# Patient Record
Sex: Male | Born: 1965 | Race: Black or African American | Hispanic: No | Marital: Single | Smoking: Current every day smoker
Health system: Southern US, Community
[De-identification: ages and names within clinical notes are randomized; demographics above are authoritative.]

## PROBLEM LIST (undated history)

## (undated) DIAGNOSIS — R42 Dizziness and giddiness: Secondary | ICD-10-CM

## (undated) DIAGNOSIS — Z720 Tobacco use: Secondary | ICD-10-CM

## (undated) DIAGNOSIS — F1010 Alcohol abuse, uncomplicated: Secondary | ICD-10-CM

## (undated) HISTORY — PX: NO PAST SURGERIES: SHX2092

---

## 2010-08-02 ENCOUNTER — Emergency Department: Payer: Self-pay | Admitting: Emergency Medicine

## 2010-12-20 ENCOUNTER — Emergency Department: Payer: Self-pay | Admitting: Emergency Medicine

## 2011-08-06 ENCOUNTER — Emergency Department: Payer: Self-pay | Admitting: Emergency Medicine

## 2012-12-30 ENCOUNTER — Emergency Department: Payer: Self-pay | Admitting: Emergency Medicine

## 2017-06-09 ENCOUNTER — Encounter: Payer: Self-pay | Admitting: Emergency Medicine

## 2017-06-09 ENCOUNTER — Inpatient Hospital Stay
Admission: EM | Admit: 2017-06-09 | Discharge: 2017-06-12 | DRG: 379 | Disposition: A | Discharge status: Home / Self Care | Payer: Self-pay | Attending: Internal Medicine | Admitting: Internal Medicine

## 2017-06-09 ENCOUNTER — Emergency Department: Payer: Self-pay

## 2017-06-09 DIAGNOSIS — F1010 Alcohol abuse, uncomplicated: Secondary | ICD-10-CM | POA: Diagnosis present

## 2017-06-09 DIAGNOSIS — E876 Hypokalemia: Secondary | ICD-10-CM | POA: Diagnosis present

## 2017-06-09 DIAGNOSIS — K922 Gastrointestinal hemorrhage, unspecified: Secondary | ICD-10-CM | POA: Diagnosis present

## 2017-06-09 DIAGNOSIS — Z23 Encounter for immunization: Secondary | ICD-10-CM

## 2017-06-09 DIAGNOSIS — E663 Overweight: Secondary | ICD-10-CM | POA: Diagnosis present

## 2017-06-09 DIAGNOSIS — F17200 Nicotine dependence, unspecified, uncomplicated: Secondary | ICD-10-CM | POA: Diagnosis present

## 2017-06-09 DIAGNOSIS — K625 Hemorrhage of anus and rectum: Secondary | ICD-10-CM

## 2017-06-09 DIAGNOSIS — K921 Melena: Principal | ICD-10-CM | POA: Diagnosis present

## 2017-06-09 DIAGNOSIS — R55 Syncope and collapse: Secondary | ICD-10-CM

## 2017-06-09 DIAGNOSIS — Z6825 Body mass index (BMI) 25.0-25.9, adult: Secondary | ICD-10-CM

## 2017-06-09 HISTORY — DX: Tobacco use: Z72.0

## 2017-06-09 HISTORY — DX: Alcohol abuse, uncomplicated: F10.10

## 2017-06-09 LAB — CBC
HEMATOCRIT: 29.4 % — AB (ref 40.0–52.0)
HEMOGLOBIN: 10 g/dL — AB (ref 13.0–18.0)
MCH: 32.9 pg (ref 26.0–34.0)
MCHC: 34.1 g/dL (ref 32.0–36.0)
MCV: 96.6 fL (ref 80.0–100.0)
Platelets: 207 10*3/uL (ref 150–440)
RBC: 3.05 MIL/uL — ABNORMAL LOW (ref 4.40–5.90)
RDW: 12.1 % (ref 11.5–14.5)
WBC: 13 10*3/uL — AB (ref 3.8–10.6)

## 2017-06-09 LAB — COMPREHENSIVE METABOLIC PANEL
ALBUMIN: 3 g/dL — AB (ref 3.5–5.0)
ALT: 13 U/L — ABNORMAL LOW (ref 17–63)
ANION GAP: 10 (ref 5–15)
AST: 23 U/L (ref 15–41)
Alkaline Phosphatase: 58 U/L (ref 38–126)
BUN: 9 mg/dL (ref 6–20)
CHLORIDE: 104 mmol/L (ref 101–111)
CO2: 20 mmol/L — ABNORMAL LOW (ref 22–32)
Calcium: 8 mg/dL — ABNORMAL LOW (ref 8.9–10.3)
Creatinine, Ser: 1.2 mg/dL (ref 0.61–1.24)
GFR calc Af Amer: >60 mL/min (ref >60)
GFR calc non Af Amer: >60 mL/min (ref >60)
GLUCOSE: 139 mg/dL — AB (ref 65–99)
POTASSIUM: 3.1 mmol/L — AB (ref 3.5–5.1)
Sodium: 134 mmol/L — ABNORMAL LOW (ref 135–145)
Total Bilirubin: 0.6 mg/dL (ref 0.3–1.2)
Total Protein: 5.7 g/dL — ABNORMAL LOW (ref 6.5–8.1)

## 2017-06-09 LAB — TYPE AND SCREEN
ABO/RH(D): O POS
ANTIBODY SCREEN: NEGATIVE

## 2017-06-09 LAB — LIPASE, BLOOD: LIPASE: 17 U/L (ref 11–51)

## 2017-06-09 LAB — TROPONIN I: Troponin I: <0.03 ng/mL (ref <0.03)

## 2017-06-09 MED ORDER — SODIUM CHLORIDE 0.9 % IV BOLUS (SEPSIS)
1000.0000 mL | Freq: Once | INTRAVENOUS | Status: AC
  Administered 2017-06-09: 1000 mL via INTRAVENOUS

## 2017-06-09 NOTE — ED Notes (Signed)
Pt went to nuclear medicine for a study.

## 2017-06-09 NOTE — ED Provider Notes (Signed)
[facility] Emergency Department Provider Note  ____________________________________________   First MD Initiated Contact with Patient 06/09/17 2202     (approximate)  I have reviewed the triage vital signs and the nursing notes.   HISTORY  Chief Complaint Rectal Bleeding   HPI Matthew Nolan is a 51 y.o. male with a history of alcoholism who was presented to the emergency department today with blood per rectum. He has had 2 episodes of bleeding since 8 PM tonight. An initial episode of bleeding which was mild to moderate and then another 1:30 to 40 minutes ago with copious amount of clot. He also was near syncopal at this time the patient call the ambulance and said that he was he was going to pass out. He also vomited once but says that he has vomited beer. He says that he drinks daily and has had 6 beers today. Does not take any anticoagulants. Does not take any medications at home on a regular basis. Reports over the past 2 days that he has had some mild suprapubic as well as left lower quadrant abdominal cramping.patient says that he does not feel like he needs to move his bowels at this time.   History reviewed. No pertinent past medical history.  There are no active problems to display for this patient.   History reviewed. No pertinent surgical history.  Prior to Admission medications   Not on File    Allergies Patient has no known allergies.  History reviewed. No pertinent family history.  Social History Social History  Substance Use Topics  . Smoking status: Current Every Day Smoker  . Smokeless tobacco: Never Used  . Alcohol use 25.2 oz/week    42 Cans of beer per week    Review of Systems  Constitutional: No fever/chills Eyes: No visual changes. ENT: No sore throat. Cardiovascular: Denies chest pain. Respiratory: Denies shortness of breath. Gastrointestinal:  No diarrhea.  No constipation. Genitourinary: Negative for  dysuria. Musculoskeletal: Negative for back pain. Skin: Negative for rash. Neurological: Negative for headaches, focal weakness or numbness.   ____________________________________________   PHYSICAL EXAM:  VITAL SIGNS: ED Triage Vitals [06/09/17 2157]  Enc Vitals Group     BP      Pulse      Resp      Temp      Temp src      SpO2      Weight 168 lb (76.2 kg)     Height 5\' 8"  (1.727 m)     Head Circumference      Peak Flow      Pain Score      Pain Loc      Pain Edu?      Excl. in [place]?     Constitutional: Alert and oriented. no acute distress. There is maroon blood staining on the patient's socks. Eyes: Conjunctivae are normal.  Head: Atraumatic. Nose: No congestion/rhinnorhea. Mouth/Throat: Mucous membranes are moist.  Neck: No stridor.   Cardiovascular: Normal rate, regular rhythm. Grossly normal heart sounds.  Respiratory: Normal respiratory effort.  No retractions. Lungs CTAB. Gastrointestinal: Soft with minimal tenderness to the suprapubic as well as left lower quadrant without distention, rebound or guarding.  No distention. maroon blood surrounding the anus, exteriorly. Musculoskeletal: No lower extremity tenderness nor edema.  No joint effusions. Neurologic:  Normal speech and language. No gross focal neurologic deficits are appreciated. Skin:  Skin is warm, dry and intact. No rash noted. Psychiatric: Mood and affect are normal.  Speech and behavior are normal.  ____________________________________________   LABS (all labs ordered are listed, but only abnormal results are displayed)  Labs Reviewed  COMPREHENSIVE METABOLIC PANEL - Abnormal; Notable for the following:       Result Value   Sodium 134 (*)    Potassium 3.1 (*)    CO2 20 (*)    Glucose, Bld 139 (*)    Calcium 8.0 (*)    Total Protein 5.7 (*)    Albumin 3.0 (*)    ALT 13 (*)    All other components within normal limits  CBC - Abnormal; Notable for the following:    WBC 13.0 (*)    RBC 3.05  (*)    Hemoglobin 10.0 (*)    HCT 29.4 (*)    All other components within normal limits  TROPONIN I  LIPASE, BLOOD  PROTIME-INR  APTT  POC OCCULT BLOOD, ED  TYPE AND SCREEN   ____________________________________________  EKG  ED ECG REPORT I, Doran Stabler, the attending physician, personally viewed and interpreted this ECG.   Date: 06/09/2017  EKG Time: 2204  Rate: 99  Rhythm: normal sinus rhythm  Axis: Normal  Intervals:none  ST&T Change: 1 mm ST elevation in V3 with 0.5 mm ST elevation in V4 and V5. No reciprocal depression. Single T-wave inversion in aVL.  ____________________________________________  RADIOLOGY   ____________________________________________   PROCEDURES  Procedure(s) performed:   Procedures  Critical Care performed:  CRITICAL CARE Performed by: Doran Stabler   Total critical care time: 35 minutes  Critical care time was exclusive of separately billable procedures and treating other patients.  Critical care was necessary to treat or prevent imminent or life-threatening deterioration.  Critical care was time spent personally by me on the following activities: development of treatment plan with patient and/or surrogate as well as nursing, discussions with consultants, evaluation of patient's response to treatment, examination of patient, obtaining history from patient or surrogate, ordering and performing treatments and interventions, ordering and review of laboratory studies, ordering and review of radiographic studies, pulse oximetry and re-evaluation of patient's condition.   ____________________________________________   INITIAL IMPRESSION / ASSESSMENT AND PLAN / ED COURSE  Pertinent labs & imaging results that were available during my care of the patient were reviewed by me and considered in my medical decision making (see chart for details).  DX: Lower GI bleeding, diverticulosis, upper GI bleed with rapid transit  time.  ----------------------------------------- 11:47 PM on 06/09/2017 -----------------------------------------  Discussed the case with Dr. Lance Morin who agrees with the tagged red blood cell scan and possible IR intervention. I also discussed the case with the interventional radiologist Dr. Earleen Newport who is unsure of the interventional capabilities here at [facility] and says that we are unable to do any intervention overnight. I then discussed the case with Dr. Dion Saucier at [facility]  who recommends a CT angiography of the abdomen and pelvis and reconsultation with Dr. Marius Ditch at [facility].  pending pages from GI at [facility] at this time. Signed out to Dr. Owens Shark.patient has remained stable with a heart rate in the 90s and blood pressures in the 1 teens to 120s throughout his ER stay.no further GI bleeding at this time.      ____________________________________________   FINAL CLINICAL IMPRESSION(S) / ED DIAGNOSES  lower GI bleeding.  Near syncope.      NEW MEDICATIONS STARTED DURING THIS VISIT:  New Prescriptions   No medications on file  Note:  This document was prepared using Dragon voice recognition software and may include unintentional dictation errors.     Orbie Pyo, MD 06/09/17 2350

## 2017-06-09 NOTE — ED Notes (Signed)
[facility] has been called to initiate transfer per Dr. Marolyn Haller orders.

## 2017-06-09 NOTE — ED Triage Notes (Signed)
Pt arrived to ED via EMS from home where EMS reports pt has had bloody stools x3 days and today at 1400 the bleeding worsened and pt started to pass clots. Per EMS pt was diaphoretic on arrival to home with BP 70/40, actively bleeding from rectum. On arrival to ED pt is A&O x4, actively bleeding from rectum, BP 111/74. MD to bedside for further evaluation. Pt drinks approximately 6 beers per day, no known medical HX, per pt.

## 2017-06-10 ENCOUNTER — Encounter: Payer: Self-pay | Admitting: Internal Medicine

## 2017-06-10 ENCOUNTER — Other Ambulatory Visit: Payer: Self-pay

## 2017-06-10 DIAGNOSIS — F1410 Cocaine abuse, uncomplicated: Secondary | ICD-10-CM

## 2017-06-10 DIAGNOSIS — K922 Gastrointestinal hemorrhage, unspecified: Secondary | ICD-10-CM | POA: Diagnosis present

## 2017-06-10 DIAGNOSIS — K625 Hemorrhage of anus and rectum: Secondary | ICD-10-CM

## 2017-06-10 DIAGNOSIS — F1010 Alcohol abuse, uncomplicated: Secondary | ICD-10-CM

## 2017-06-10 LAB — CBC
HCT: 27.8 % — ABNORMAL LOW (ref 40.0–52.0)
Hemoglobin: 9.6 g/dL — ABNORMAL LOW (ref 13.0–18.0)
MCH: 33.7 pg (ref 26.0–34.0)
MCHC: 34.5 g/dL (ref 32.0–36.0)
MCV: 97.7 fL (ref 80.0–100.0)
Platelets: 192 10*3/uL (ref 150–440)
RBC: 2.84 MIL/uL — ABNORMAL LOW (ref 4.40–5.90)
RDW: 12.3 % (ref 11.5–14.5)
WBC: 10.4 10*3/uL (ref 3.8–10.6)

## 2017-06-10 LAB — APTT: aPTT: 31 seconds (ref 24–36)

## 2017-06-10 LAB — HEMOGLOBIN A1C
Hgb A1c MFr Bld: 4.4 % — ABNORMAL LOW (ref 4.8–5.6)
Mean Plasma Glucose: 79.58 mg/dL

## 2017-06-10 LAB — PROTIME-INR
INR: 1.04
Prothrombin Time: 13.5 seconds (ref 11.4–15.2)

## 2017-06-10 LAB — URINE DRUG SCREEN, QUALITATIVE (ARMC ONLY)
Amphetamines, Ur Screen: NOT DETECTED
BARBITURATES, UR SCREEN: NOT DETECTED
BENZODIAZEPINE, UR SCRN: NOT DETECTED
Cannabinoid 50 Ng, Ur ~~LOC~~: POSITIVE — AB
Cocaine Metabolite,Ur ~~LOC~~: POSITIVE — AB
MDMA (Ecstasy)Ur Screen: NOT DETECTED
METHADONE SCREEN, URINE: NOT DETECTED
Opiate, Ur Screen: NOT DETECTED
Phencyclidine (PCP) Ur S: NOT DETECTED
Tricyclic, Ur Screen: NOT DETECTED

## 2017-06-10 LAB — HEMOGLOBIN AND HEMATOCRIT, BLOOD
HEMATOCRIT: 28.4 % — AB (ref 40.0–52.0)
HEMATOCRIT: 25 % — AB (ref 40.0–52.0)
HEMOGLOBIN: 8.6 g/dL — AB (ref 13.0–18.0)
Hemoglobin: 9.6 g/dL — ABNORMAL LOW (ref 13.0–18.0)

## 2017-06-10 LAB — TSH: TSH: 5.764 u[IU]/mL — ABNORMAL HIGH (ref 0.350–4.500)

## 2017-06-10 MED ORDER — DOCUSATE SODIUM 100 MG PO CAPS
100.0000 mg | ORAL_CAPSULE | Freq: Two times a day (BID) | ORAL | Status: DC
  Administered 2017-06-11 – 2017-06-12 (×3): 100 mg via ORAL
  Filled 2017-06-10 (×5): qty 1

## 2017-06-10 MED ORDER — ONDANSETRON HCL 4 MG/2ML IJ SOLN: 4.0000 mg | Freq: Four times a day (QID) | INTRAMUSCULAR | Status: DC | PRN

## 2017-06-10 MED ORDER — THIAMINE HCL 100 MG/ML IJ SOLN: 100.0000 mg | Freq: Every day | INTRAMUSCULAR | Status: DC

## 2017-06-10 MED ORDER — TECHNETIUM TC 99M-LABELED RED BLOOD CELLS IV KIT
20.0000 | PACK | Freq: Once | INTRAVENOUS | Status: AC | PRN
  Administered 2017-06-10: 21.7 via INTRAVENOUS

## 2017-06-10 MED ORDER — INFLUENZA VAC SPLIT QUAD 0.5 ML IM SUSY
0.5000 mL | PREFILLED_SYRINGE | INTRAMUSCULAR | Status: AC
  Administered 2017-06-12: 0.5 mL via INTRAMUSCULAR
  Filled 2017-06-10: qty 0.5

## 2017-06-10 MED ORDER — LORAZEPAM 2 MG/ML IJ SOLN: 0.0000 mg | Freq: Four times a day (QID) | INTRAMUSCULAR | Status: DC

## 2017-06-10 MED ORDER — FOLIC ACID 1 MG PO TABS
1.0000 mg | ORAL_TABLET | Freq: Every day | ORAL | Status: DC
  Administered 2017-06-10: 1 mg via ORAL
  Filled 2017-06-10: qty 1

## 2017-06-10 MED ORDER — ACETAMINOPHEN 650 MG RE SUPP: 650.0000 mg | Freq: Four times a day (QID) | RECTAL | Status: DC | PRN

## 2017-06-10 MED ORDER — POTASSIUM CHLORIDE 10 MEQ/100ML IV SOLN
10.0000 meq | INTRAVENOUS | Status: AC
  Administered 2017-06-10 (×4): 10 meq via INTRAVENOUS
  Filled 2017-06-10 (×4): qty 100

## 2017-06-10 MED ORDER — PANTOPRAZOLE SODIUM 40 MG IV SOLR
40.0000 mg | Freq: Two times a day (BID) | INTRAVENOUS | Status: DC
  Administered 2017-06-10 – 2017-06-12 (×5): 40 mg via INTRAVENOUS
  Filled 2017-06-10 (×5): qty 40

## 2017-06-10 MED ORDER — LORAZEPAM 2 MG/ML IJ SOLN
0.0000 mg | Freq: Two times a day (BID) | INTRAMUSCULAR | Status: DC
Start: 2017-06-13 — End: 2017-06-12

## 2017-06-10 MED ORDER — THIAMINE HCL 100 MG/ML IJ SOLN
Freq: Once | INTRAVENOUS | Status: DC
  Filled 2017-06-10: qty 1000

## 2017-06-10 MED ORDER — VITAMIN B-1 100 MG PO TABS
100.0000 mg | ORAL_TABLET | Freq: Every day | ORAL | Status: DC
  Administered 2017-06-10: 100 mg via ORAL
  Filled 2017-06-10: qty 1

## 2017-06-10 MED ORDER — M.V.I. ADULT IV INJ
INJECTION | Freq: Once | INTRAVENOUS | Status: AC
  Administered 2017-06-10: 17:00:00 via INTRAVENOUS
  Filled 2017-06-10: qty 1000

## 2017-06-10 MED ORDER — LORAZEPAM 2 MG/ML IJ SOLN: 1.0000 mg | Freq: Four times a day (QID) | INTRAMUSCULAR | Status: DC | PRN

## 2017-06-10 MED ORDER — ADULT MULTIVITAMIN W/MINERALS CH
1.0000 | ORAL_TABLET | Freq: Every day | ORAL | Status: DC
  Administered 2017-06-10 – 2017-06-12 (×3): 1 via ORAL
  Filled 2017-06-10 (×3): qty 1

## 2017-06-10 MED ORDER — POTASSIUM CHLORIDE 10 MEQ/100ML IV SOLN
10.0000 meq | INTRAVENOUS | Status: DC
  Filled 2017-06-10 (×4): qty 100

## 2017-06-10 MED ORDER — LORAZEPAM 1 MG PO TABS: 1.0000 mg | ORAL_TABLET | Freq: Four times a day (QID) | ORAL | Status: DC | PRN

## 2017-06-10 MED ORDER — SODIUM CHLORIDE 0.9 % IV SOLN
INTRAVENOUS | Status: DC
  Administered 2017-06-11: 11:00:00 via INTRAVENOUS

## 2017-06-10 MED ORDER — ACETAMINOPHEN 325 MG PO TABS: 650.0000 mg | ORAL_TABLET | Freq: Four times a day (QID) | ORAL | Status: DC | PRN

## 2017-06-10 MED ORDER — SODIUM CHLORIDE 0.9 % IV SOLN
INTRAVENOUS | Status: DC
  Administered 2017-06-10 (×2): via INTRAVENOUS

## 2017-06-10 MED ORDER — PEG 3350-KCL-NA BICARB-NACL 420 G PO SOLR
4000.0000 mL | Freq: Once | ORAL | Status: DC
  Filled 2017-06-10: qty 4000

## 2017-06-10 MED ORDER — ONDANSETRON HCL 4 MG PO TABS: 4.0000 mg | ORAL_TABLET | Freq: Four times a day (QID) | ORAL | Status: DC | PRN

## 2017-06-10 NOTE — H&P (Signed)
Matthew Nolan is an 51 y.o. male.   Chief Complaint: Rectal bleeding HPI: The patient with past medical history of alcohol and tobacco abuse presents emergency department with bright red bleeding per rectum. The patient had 2 bowel movements both of which were grossly bloody. The second bowel movement also had a heavy burden of clot. The patient underwent a bleeding scan in the emergency department and gastroenterology has agreed to see him. He is hemodynamically stable. He reports drinking at least 6 beers per day. The hospitalist service was contacted for admission and medical management.  Past Medical History:  Diagnosis Date  . Alcohol abuse   . Tobacco abuse     Past Surgical History:  Procedure Laterality Date  . NO PAST SURGERIES      Family History  Problem Relation Age of Onset  . Family history unknown: Yes   Social History:  reports that he has been smoking.  He has never used smokeless tobacco. He reports that he drinks about 25.2 oz of alcohol per week . He reports that he does not use drugs.  Allergies: No Known Allergies  No prescriptions prior to admission.    Results for orders placed or performed during the hospital encounter of 06/09/17 (from the past 48 hour(s))  Comprehensive metabolic panel     Status: Abnormal   Collection Time: 06/09/17  9:57 PM  Result Value Ref Range   Sodium 134 (L) 135 - 145 mmol/L   Potassium 3.1 (L) 3.5 - 5.1 mmol/L   Chloride 104 101 - 111 mmol/L   CO2 20 (L) 22 - 32 mmol/L   Glucose, Bld 139 (H) 65 - 99 mg/dL   BUN 9 6 - 20 mg/dL   Creatinine, Ser 1.20 0.61 - 1.24 mg/dL   Calcium 8.0 (L) 8.9 - 10.3 mg/dL   Total Protein 5.7 (L) 6.5 - 8.1 g/dL   Albumin 3.0 (L) 3.5 - 5.0 g/dL   AST 23 15 - 41 U/L   ALT 13 (L) 17 - 63 U/L   Alkaline Phosphatase 58 38 - 126 U/L   Total Bilirubin 0.6 0.3 - 1.2 mg/dL   GFR calc non Af Amer >60 >60 mL/min   GFR calc Af Amer >60 >60 mL/min    Comment: (NOTE) The eGFR has been calculated using  the CKD EPI equation. This calculation has not been validated in all clinical situations. eGFR's persistently <60 mL/min signify possible Chronic Kidney Disease.    Anion gap 10 5 - 15  CBC     Status: Abnormal   Collection Time: 06/09/17  9:57 PM  Result Value Ref Range   WBC 13.0 (H) 3.8 - 10.6 K/uL   RBC 3.05 (L) 4.40 - 5.90 MIL/uL   Hemoglobin 10.0 (L) 13.0 - 18.0 g/dL   HCT 29.4 (L) 40.0 - 52.0 %   MCV 96.6 80.0 - 100.0 fL   MCH 32.9 26.0 - 34.0 pg   MCHC 34.1 32.0 - 36.0 g/dL   RDW 12.1 11.5 - 14.5 %   Platelets 207 150 - 440 K/uL  Type and screen [facility]     Status: None   Collection Time: 06/09/17  9:57 PM  Result Value Ref Range   ABO/RH(D) O POS    Antibody Screen NEG    Sample Expiration 06/12/2017   Troponin I     Status: None   Collection Time: 06/09/17  9:57 PM  Result Value Ref Range   Troponin I <0.03 <0.03 ng/mL  Lipase, blood     Status: None   Collection Time: 06/09/17  9:57 PM  Result Value Ref Range   Lipase 17 11 - 51 U/L  TSH     Status: Abnormal   Collection Time: 06/09/17  9:57 PM  Result Value Ref Range   TSH 5.764 (H) 0.350 - 4.500 uIU/mL    Comment: Performed by a 3rd Generation assay with a functional sensitivity of <=0.01 uIU/mL.  Protime-INR     Status: None   Collection Time: 06/10/17  4:37 AM  Result Value Ref Range   Prothrombin Time 13.5 11.4 - 15.2 seconds   INR 1.04   APTT     Status: None   Collection Time: 06/10/17  4:37 AM  Result Value Ref Range   aPTT 31 24 - 36 seconds  CBC     Status: Abnormal   Collection Time: 06/10/17  4:37 AM  Result Value Ref Range   WBC 10.4 3.8 - 10.6 K/uL   RBC 2.84 (L) 4.40 - 5.90 MIL/uL   Hemoglobin 9.6 (L) 13.0 - 18.0 g/dL   HCT 27.8 (L) 40.0 - 52.0 %   MCV 97.7 80.0 - 100.0 fL   MCH 33.7 26.0 - 34.0 pg   MCHC 34.5 32.0 - 36.0 g/dL   RDW 12.3 11.5 - 14.5 %   Platelets 192 150 - 440 K/uL   Nm Gi Blood Loss  Result Date: 06/10/2017 CLINICAL DATA:  Acute onset of  bloody stools and epigastric cramping. Initial encounter. EXAM: NUCLEAR MEDICINE GASTROINTESTINAL BLEEDING SCAN TECHNIQUE: Sequential abdominal images were obtained following intravenous administration of Tc-97mlabeled red blood cells. RADIOPHARMACEUTICALS:  21.70 mCi Tc-971mn-vitro labeled red cells. COMPARISON:  None. FINDINGS: No abnormal accumulation of activity is visualized to suggest active hemorrhage into small or large bowel. There is normal blood pool and accumulation of activity within the heart, liver and spleen. There is accumulation of activity within the bladder, and also within the penile shaft. IMPRESSION: No evidence for active GI bleeding at this time. Electronically Signed   By: JeGarald Balding.D.   On: 06/10/2017 03:25    Review of Systems  Constitutional: Negative for chills and fever.  HENT: Negative for sore throat and tinnitus.   Eyes: Negative for blurred vision and redness.  Respiratory: Negative for cough and shortness of breath.   Cardiovascular: Negative for chest pain, palpitations, orthopnea and PND.  Gastrointestinal: Positive for blood in stool. Negative for abdominal pain, diarrhea, nausea and vomiting.  Genitourinary: Negative for dysuria, frequency and urgency.  Musculoskeletal: Negative for joint pain and myalgias.  Skin: Negative for rash.       No lesions  Neurological: Negative for speech change, focal weakness and weakness.  Endo/Heme/Allergies: Does not bruise/bleed easily.       No temperature intolerance  Psychiatric/Behavioral: Negative for depression and suicidal ideas.    Blood pressure 114/81, pulse 91, temperature 97.9 F (36.6 C), temperature source Oral, resp. rate 19, height 5' 8"  (1.727 m), weight 70.8 kg (156 lb), SpO2 100 %. Physical Exam  Nursing note and vitals reviewed. Constitutional: He is oriented to person, place, and time. He appears well-developed and well-nourished. No distress.  HENT:  Head: Normocephalic and atraumatic.   Mouth/Throat: Oropharynx is clear and moist.  Eyes: Pupils are equal, round, and reactive to light. Conjunctivae and EOM are normal. No scleral icterus.  Neck: Normal range of motion. Neck supple. No JVD present. No tracheal deviation present. No thyromegaly present.  Cardiovascular: Normal  rate, regular rhythm and normal heart sounds.  Exam reveals no gallop and no friction rub.   No murmur heard. Respiratory: Effort normal and breath sounds normal. No respiratory distress.  GI: Soft. Bowel sounds are normal. He exhibits no distension. There is no tenderness.  Genitourinary:  Genitourinary Comments: Deferred  Musculoskeletal: Normal range of motion. He exhibits no edema.  Lymphadenopathy:    He has no cervical adenopathy.  Neurological: He is alert and oriented to person, place, and time. No cranial nerve deficit.  Skin: Skin is warm and dry. No rash noted. No erythema.  Psychiatric: He has a normal mood and affect. His behavior is normal. Judgment and thought content normal.     Assessment/Plan This is a 51 year old admitted for GI bleed. 1. GI bleed: Bleeding scan shows no evidence of active bleed at this time. Imaging obtained 2 large-bore IVs. Normal saline for volume resuscitation. Gastroenterology on board. 2. Hypokalemia: Replete potassium 3. Alcohol abuse: Hydrate with intravenous fluid. Initiate CIWA protocol in 24-48 hours. 4. Overweight: BMI is 25.5; encourage diet and exercise 5. DVT prophylaxis: SCDs 6. GI prophylaxis: Pantoprazole IV every 12 hours The patient is a full code. Time spent on admission orders and patient care approximately 45 minutes  Harrie Foreman, MD 06/10/2017, 7:47 AM

## 2017-06-10 NOTE — Progress Notes (Addendum)
[facility]    MR#:  [id number]  DATE OF BIRTH:  1965/09/21  SUBJECTIVE:  CHIEF COMPLAINT:  Patient is reporting abdominal discomfort and bloating . Denies any nausea or vomiting; had last bowel movement this a.m. noticed light-colored blood  REVIEW OF SYSTEMS:  CONSTITUTIONAL: No fever, fatigue or weakness.  EYES: No blurred or double vision.  EARS, NOSE, AND THROAT: No tinnitus or ear pain.  RESPIRATORY: No cough, shortness of breath, wheezing or hemoptysis.  CARDIOVASCULAR: No chest pain, orthopnea, edema.  GASTROINTESTINAL: No nausea, vomiting, diarrhea or abdominal pain.  GENITOURINARY: No dysuria, hematuria.  ENDOCRINE: No polyuria, nocturia,  HEMATOLOGY: No anemia, easy bruising or bleeding SKIN: No rash or lesion. MUSCULOSKELETAL: No joint pain or arthritis.   NEUROLOGIC: No tingling, numbness, weakness.  PSYCHIATRY: No anxiety or depression.   DRUG ALLERGIES:  No Known Allergies  VITALS:  Blood pressure 127/72, pulse 85, temperature 98.6 F (37 C), temperature source Oral, resp. rate 18, height 5\' 8"  (1.727 m), weight 70.8 kg (156 lb), SpO2 100 %.  PHYSICAL EXAMINATION:  GENERAL:  51 y.o.-year-old patient lying in the bed with no acute distress.  EYES: Pupils equal, round, reactive to light and accommodation. No scleral icterus. Extraocular muscles intact.  HEENT: Head atraumatic, normocephalic. Oropharynx and nasopharynx clear.  NECK:  Supple, no jugular venous distention. No thyroid enlargement, no tenderness.  LUNGS: Normal breath sounds bilaterally, no wheezing, rales,rhonchi or crepitation. No use of accessory muscles of respiration.  CARDIOVASCULAR: S1, S2 normal. No murmurs, rubs, or gallops.  ABDOMEN: Soft, nontender, nondistended. Bowel sounds present. No organomegaly or mass.  EXTREMITIES: No pedal edema, cyanosis, or clubbing.  NEUROLOGIC: Cranial nerves II through XII are  intact. Muscle strength 5/5 in all extremities. Sensation intact. Gait not checked.  PSYCHIATRIC: The patient is alert and oriented x 3.  SKIN: No obvious rash, lesion, or ulcer.    LABORATORY PANEL:   CBC  Recent Labs Lab 06/10/17 0437  WBC 10.4  HGB 9.6*  HCT 27.8*  PLT 192   ------------------------------------------------------------------------------------------------------------------  Chemistries   Recent Labs Lab 06/09/17 2157  NA 134*  K 3.1*  CL 104  CO2 20*  GLUCOSE 139*  BUN 9  CREATININE 1.20  CALCIUM 8.0*  AST 23  ALT 13*  ALKPHOS 58  BILITOT 0.6   ------------------------------------------------------------------------------------------------------------------  Cardiac Enzymes  Recent Labs Lab 06/09/17 2157  TROPONINI <0.03   ------------------------------------------------------------------------------------------------------------------  RADIOLOGY:  Nm Gi Blood Loss  Result Date: 06/10/2017 CLINICAL DATA:  Acute onset of bloody stools and epigastric cramping. Initial encounter. EXAM: NUCLEAR MEDICINE GASTROINTESTINAL BLEEDING SCAN TECHNIQUE: Sequential abdominal images were obtained following intravenous administration of Tc-91m labeled red blood cells. RADIOPHARMACEUTICALS:  21.70 mCi Tc-66m in-vitro labeled red cells. COMPARISON:  None. FINDINGS: No abnormal accumulation of activity is visualized to suggest active hemorrhage into small or large bowel. There is normal blood pool and accumulation of activity within the heart, liver and spleen. There is accumulation of activity within the bladder, and also within the penile shaft. IMPRESSION: No evidence for active GI bleeding at this time. Electronically Signed   By: Garald Balding M.D.   On: 06/10/2017 03:25    EKG:   Orders placed or performed during the hospital encounter of 06/09/17  . ED EKG  . ED EKG  . EKG 12-Lead  . EKG 12-Lead    ASSESSMENT AND PLAN:    This is a 51 year old  admitted for  GI bleed.  1. GI bleed:  Bleeding scan shows no evidence of active bleed at this time.  Nothing by mouth, clear liquids  Seen by gastroenterology scheduled for EGD and colonoscopy tomorrow if UDS is negative Continue IV fluids Monitor hemoglobin and hematocrit and transfuse as needed; hemoglobin 10.0-9.6  2. Hypokalemia: Replete potassium, check BMP in a.m.  3. Alcohol abuse: Hydrate with intravenous fluid. Banana bag   CIWA protocol Urine drug screen is positive for cocaine and cannabinoids  4. Overweight: BMI is 25.5; encourage diet and exercise  5. DVT prophylaxis: SCDs  6. GI prophylaxis: Pantoprazole IV every 12 hours    All the records are reviewed and case discussed with Care Management/Social Workerr. Management plans discussed with the patient, family and they are in agreement.  CODE STATUS: FC  TOTAL TIME TAKING CARE OF THIS PATIENT: 35 minutes.   POSSIBLE D/C IN 1-2 DAYS, DEPENDING ON CLINICAL CONDITION.  Note: This dictation was prepared with Dragon dictation along with smaller phrase technology. Any transcriptional errors that result from this process are unintentional.   Nicholes Mango M.D on 06/10/2017 at 3:50 PM  Between 7am to 6pm - Pager - [phone] After 6pm go to [email or web address] - password [email or web address] [facility]  Office  [phone]  CC: Primary care physician; Patient, No Pcp Per

## 2017-06-10 NOTE — Consult Note (Signed)
Jonathon Bellows MD, MRCP(U.K) [place]  [place], [place] [address]  Main: [phone]  Fax: [phone]  Consultation  Referring Provider:   Dr Margaretmary Eddy Primary Care Physician:  Patient, No Pcp Per Primary Gastroenterologist:  None          Reason for Consultation:     GI bleed   Date of Admission:  06/09/2017 Date of Consultation:  06/10/2017         HPI:   Toby Sutch is a 51 y.o. male with a history of alcohol abuse presented to the ER with rectal bleeding which began last night at 8 pm , vomiting once which was non bloody . Over the last 2 days has had some lower abdominal pain. In the Er had a tagged RBC scan which was negative for an active bleed. On admission Hb 10 grams with MCV 96,BUN not elevated. INR 1.04,platelet count 207 .  He says he has been drinking a 6 pack a day for the past 7 years. Snorted cocaine yesterday , very occasional NSAID use. Was doing find till last evening when all of a sudden had some lower abdominal cramping and then had a bloody red bowel movement and almost passed out, recurred 3 times and came into the hospital . Last episode of rectal bleeding this am . No abdominal pain presently. Denies any hematemesis.   Past Medical History:  Diagnosis Date  . Alcohol abuse   . Tobacco abuse     Past Surgical History:  Procedure Laterality Date  . NO PAST SURGERIES      Prior to Admission medications   Not on File    Family History  Problem Relation Age of Onset  . Family history unknown: Yes     Social History  Substance Use Topics  . Smoking status: Current Every Day Smoker  . Smokeless tobacco: Never Used  . Alcohol use 25.2 oz/week    42 Cans of beer per week    Allergies as of 06/09/2017  . (No Known Allergies)    Review of Systems:    All systems reviewed and negative except where noted in HPI.   Physical Exam:  Vital signs in last 24 hours: Temp:  [97.9 F (36.6 C)] 97.9 F (36.6 C) (10/10 0325) Pulse  Rate:  [91-99] 91 (10/10 0325) Resp:  [17-19] 19 (10/10 0325) BP: (111-136)/(69-81) 114/81 (10/10 0325) SpO2:  [98 %-100 %] 100 % (10/10 0325) Weight:  [156 lb (70.8 kg)-168 lb (76.2 kg)] 156 lb (70.8 kg) (10/10 0325) Last BM Date: 06/09/17 General:   Pleasant, cooperative in NAD Head:  Normocephalic and atraumatic. Eyes:   No icterus.   Conjunctiva pink. PERRLA. Ears:  Normal auditory acuity. Neck:  Supple; no masses or thyroidomegaly Lungs: Respirations even and unlabored. Lungs clear to auscultation bilaterally.   No wheezes, crackles, or rhonchi.  Heart:  Regular rate and rhythm;  Without murmur, clicks, rubs or gallops Abdomen:  Soft, nondistended, nontender. Normal bowel sounds. No appreciable masses or hepatomegaly.  No rebound or guarding.  Extremities:  Without edema, cyanosis or clubbing. Neurologic:  Alert and oriented x3;  grossly normal neurologically. Skin:  Intact without significant lesions or rashes. Cervical Nodes:  No significant cervical adenopathy. Psych:  Alert and cooperative. Normal affect.  LAB RESULTS:  Recent Labs  06/09/17 2157 06/10/17 0437  WBC 13.0* 10.4  HGB 10.0* 9.6*  HCT 29.4* 27.8*  PLT 207 192   BMET  Recent Labs  06/09/17 2157  NA 134*  K 3.1*  CL 104  CO2 20*  GLUCOSE 139*  BUN 9  CREATININE 1.20  CALCIUM 8.0*   LFT  Recent Labs  06/09/17 2157  PROT 5.7*  ALBUMIN 3.0*  AST 23  ALT 13*  ALKPHOS 58  BILITOT 0.6   PT/INR  Recent Labs  06/10/17 0437  LABPROT 13.5  INR 1.04    STUDIES: Nm Gi Blood Loss  Result Date: 06/10/2017 CLINICAL DATA:  Acute onset of bloody stools and epigastric cramping. Initial encounter. EXAM: NUCLEAR MEDICINE GASTROINTESTINAL BLEEDING SCAN TECHNIQUE: Sequential abdominal images were obtained following intravenous administration of Tc-92m labeled red blood cells. RADIOPHARMACEUTICALS:  21.70 mCi Tc-10m in-vitro labeled red cells. COMPARISON:  None. FINDINGS: No abnormal accumulation of  activity is visualized to suggest active hemorrhage into small or large bowel. There is normal blood pool and accumulation of activity within the heart, liver and spleen. There is accumulation of activity within the bladder, and also within the penile shaft. IMPRESSION: No evidence for active GI bleeding at this time. Electronically Signed   By: Garald Balding M.D.   On: 06/10/2017 03:25      Impression / Plan:   Loghan Munyan is a 52 y.o. y/o male with a history of rectal bleeding. BUN/Cr not elevated suggestive of a more likely lower GI bleed , it is also possible he had a brisk upper bleed . More likely a diverticular bleed or ischemic colitis from use of cocaine   Plan  1. NPO 2. IV vitamins/thiamine to prevent wernicke's encephalopathy 3. Watch for alcohol withdrawal 4. Advised to stop all alcohol  5. EGD+colonoscopy tomorrow if urine drug screen is negative otherwise will be after it turns negative 6. IV PPI  I have discussed alternative options, risks & benefits,  which include, but are not limited to, bleeding, infection, perforation,respiratory complication & drug reaction.  The patient agrees with this plan & written consent will be obtained.     Thank you for involving me in the care of this patient.      LOS: 0 days   Jonathon Bellows, MD  06/10/2017, 10:42 AM

## 2017-06-10 NOTE — Progress Notes (Addendum)
Dr. Margaretmary Eddy discontinued order for continuous fluids. Per MD okay to change banana bag order to 136ml/hr. MD placed order for clear liquids as pt had positive drug screen and is unable to have EGD and Colonscopy tomorrow.

## 2017-06-10 NOTE — Progress Notes (Signed)
Initial Nutrition Assessment  DOCUMENTATION CODES:   Non-severe (moderate) malnutrition in context of social or environmental circumstances  INTERVENTION:  Once diet able to be advanced, recommend Ensure Enlive po BID, each supplement provides 350 kcal and 20 grams of protein.  Continue daily multivitamin with minerals, folic acid 1 mg, and thiamine 100 mg. Recommend providing thiamine 100 mg IV as absorption of PO thiamine is decreased in EtOH abuse and patient is at risk of deficiency and development of Wernicke's encephalopathy.   NUTRITION DIAGNOSIS:   Malnutrition (Moderate) related to social / environmental circumstances (EtOH abuse, stress related to moving, cocaine use) as evidenced by mild depletion of body fat, mild depletion of muscle mass, moderate depletions of muscle mass, 7.6% weight loss over 1 month.  GOAL:   Patient will meet greater than or equal to 90% of their needs  MONITOR:   PO intake, Supplement acceptance, Diet advancement, Labs, Weight trends, I & O's  REASON FOR ASSESSMENT:   Malnutrition Screening Tool    ASSESSMENT:   51 year old male with PMHx of tobacco abuse, EtOH abuse who presented with BRBPR.   -Per [facility] Blood Loss on 10/9 there is no evidence of active bleeding. -Patient told GI he snorted cocaine before admission. Plan is for colonoscopy once urine toxicology is negative.  Met with patient at bedside. He reports he has not been eating very well for 2 years now due to his alcohol intake. When he gets home from work at [employer] everyday he drinks a six-pack of beer within about 1 hour. For the past month he has also had stress at home related to moving into a new house, which he believes is also contributing to his poor intake. He only eats 1-2 small meals daily. He may have something at work, or may have some chicken or hotdogs at home. Patient unable to provide any further details on intake. He reports he began having some abdominal pain about 2  days PTA, then noticed the BRBPR. He reports he is concerned it is something serious and is going to stop drinking now.  UBW 170 lbs. Patient reports he has lost 13 lbs (7.6% body weight) over the past month, which is significant for time frame.  Medications reviewed and include: Colace, folic acid 1 mg daily PO, Ativan, MVI daily PO, pantoprazole, thiamine 100 mg daily PO or IV (given PO today), NS @ 125 ml/hr.  Labs reviewed: Sodium 134, Potassium 3.1, CO2 20.  Nutrition-Focused physical exam completed. Findings are mild fat depletion (mild depletion of orbital region and upper arm region), mild-moderate muscle depletion (mild depletion of temple region and posterior calf region; moderate depletion of clavicle region, clavicle/acromion bone region, scapular bone region), and no edema.   Diet Order:  Diet NPO time specified Diet NPO time specified  Skin:  Reviewed, no issues  Last BM:  06/10/2017 - large type 7  Height:   Ht Readings from Last 1 Encounters:  06/10/17 5\' 8"  (1.727 m)    Weight:   Wt Readings from Last 1 Encounters:  06/10/17 156 lb (70.8 kg)    Ideal Body Weight:  70 kg  BMI:  Body mass index is 23.72 kg/m.  Estimated Nutritional Needs:   Kcal:  1860-2170 (MSJ x 1.2-1.4)  Protein:  85-100 grams (1.2-1.4 grams/kg)  Fluid:  2.1-2.5 L/day (30-35 ml/kg)  EDUCATION NEEDS:   No education needs identified at this time  08/10/17, MS, RD, LDN Office: [phone] Pager: [phone] After Hours/Weekend Pager:  [phone]  

## 2017-06-10 NOTE — Progress Notes (Signed)
Assumed care of pt @ 0330 from ED. A+Ox4. Pt reports feeling gas pains in his stomach. Passing flatus. @ 2130 pt had a large liquid BM, toilet was filled with bright red blood, no clots noticed. MD notified, CBC added on to morning lab work. Pt denies all other pain. Oriented to room and unit. Fall prevention procedures explained and implemented.

## 2017-06-11 ENCOUNTER — Encounter
Admission: EM | Disposition: A | Discharge status: Home / Self Care | Payer: Self-pay | Source: Home / Self Care | Attending: Internal Medicine

## 2017-06-11 LAB — CBC
HCT: 26.3 % — ABNORMAL LOW (ref 40.0–52.0)
Hemoglobin: 9.1 g/dL — ABNORMAL LOW (ref 13.0–18.0)
MCH: 33.9 pg (ref 26.0–34.0)
MCHC: 34.7 g/dL (ref 32.0–36.0)
MCV: 97.8 fL (ref 80.0–100.0)
PLATELETS: 187 10*3/uL (ref 150–440)
RBC: 2.69 MIL/uL — ABNORMAL LOW (ref 4.40–5.90)
RDW: 11.9 % (ref 11.5–14.5)
WBC: 4.3 10*3/uL (ref 3.8–10.6)

## 2017-06-11 LAB — URINE DRUG SCREEN, QUALITATIVE (ARMC ONLY)
Amphetamines, Ur Screen: NOT DETECTED
Barbiturates, Ur Screen: NOT DETECTED
Benzodiazepine, Ur Scrn: NOT DETECTED
Cannabinoid 50 Ng, Ur ~~LOC~~: POSITIVE — AB
Cocaine Metabolite,Ur ~~LOC~~: POSITIVE — AB
MDMA (Ecstasy)Ur Screen: NOT DETECTED
Methadone Scn, Ur: NOT DETECTED
OPIATE, UR SCREEN: NOT DETECTED
PHENCYCLIDINE (PCP) UR S: NOT DETECTED
Tricyclic, Ur Screen: NOT DETECTED

## 2017-06-11 LAB — T4, FREE: Free T4: 0.78 ng/dL (ref 0.61–1.12)

## 2017-06-11 LAB — BASIC METABOLIC PANEL
ANION GAP: 3 — AB (ref 5–15)
BUN: 7 mg/dL (ref 6–20)
CALCIUM: 8.5 mg/dL — AB (ref 8.9–10.3)
CO2: 26 mmol/L (ref 22–32)
Chloride: 108 mmol/L (ref 101–111)
Creatinine, Ser: 1.06 mg/dL (ref 0.61–1.24)
GFR calc Af Amer: >60 mL/min (ref >60)
GLUCOSE: 94 mg/dL (ref 65–99)
Potassium: 3.6 mmol/L (ref 3.5–5.1)
Sodium: 137 mmol/L (ref 135–145)

## 2017-06-11 SURGERY — ESOPHAGOGASTRODUODENOSCOPY (EGD) WITH PROPOFOL
Anesthesia: General

## 2017-06-11 NOTE — Progress Notes (Signed)
[name] NAME: Matthew Nolan    MR#:  [id number]  DATE OF BIRTH:  February 23, 1966  SUBJECTIVE:  CHIEF COMPLAINT:  Patient is Still noticing some blood in his stool. Bloated. No nausea  REVIEW OF SYSTEMS:  CONSTITUTIONAL: No fever, fatigue or weakness.  EYES: No blurred or double vision.  EARS, NOSE, AND THROAT: No tinnitus or ear pain.  RESPIRATORY: No cough, shortness of breath, wheezing or hemoptysis.  CARDIOVASCULAR: No chest pain, orthopnea, edema.  GASTROINTESTINAL: No nausea, vomiting, diarrhea or abdominal pain.  GENITOURINARY: No dysuria, hematuria.  ENDOCRINE: No polyuria, nocturia,  HEMATOLOGY: No anemia, easy bruising or bleeding SKIN: No rash or lesion. MUSCULOSKELETAL: No joint pain or arthritis.   NEUROLOGIC: No tingling, numbness, weakness.  PSYCHIATRY: No anxiety or depression.   DRUG ALLERGIES:  No Known Allergies  VITALS:  Blood pressure 132/81, pulse 74, temperature 98.2 F (36.8 C), temperature source Oral, resp. rate 18, height 5\' 8"  (1.727 m), weight 70.8 kg (156 lb), SpO2 100 %.  PHYSICAL EXAMINATION:  GENERAL:  51 y.o.-year-old patient lying in the bed with no acute distress.  EYES: Pupils equal, round, reactive to light and accommodation. No scleral icterus. Extraocular muscles intact.  HEENT: Head atraumatic, normocephalic. Oropharynx and nasopharynx clear.  NECK:  Supple, no jugular venous distention. No thyroid enlargement, no tenderness.  LUNGS: Normal breath sounds bilaterally, no wheezing, rales,rhonchi or crepitation. No use of accessory muscles of respiration.  CARDIOVASCULAR: S1, S2 normal. No murmurs, rubs, or gallops.  ABDOMEN: Soft, nontender, nondistended. Bowel sounds present. No organomegaly or mass.  EXTREMITIES: No pedal edema, cyanosis, or clubbing.  NEUROLOGIC: Cranial nerves II through XII are intact. Muscle strength 5/5 in all extremities. Sensation intact. Gait not  checked.  PSYCHIATRIC: The patient is alert and oriented x 3.  SKIN: No obvious rash, lesion, or ulcer.    LABORATORY PANEL:   CBC  Recent Labs Lab 06/11/17 0352  WBC 4.3  HGB 9.1*  HCT 26.3*  PLT 187   ------------------------------------------------------------------------------------------------------------------  Chemistries   Recent Labs Lab 06/09/17 2157 06/11/17 0352  NA 134* 137  K 3.1* 3.6  CL 104 108  CO2 20* 26  GLUCOSE 139* 94  BUN 9 7  CREATININE 1.20 1.06  CALCIUM 8.0* 8.5*  AST 23  --   ALT 13*  --   ALKPHOS 58  --   BILITOT 0.6  --    ------------------------------------------------------------------------------------------------------------------  Cardiac Enzymes  Recent Labs Lab 06/09/17 2157  TROPONINI <0.03   ------------------------------------------------------------------------------------------------------------------  RADIOLOGY:  Nm Gi Blood Loss  Result Date: 06/10/2017 CLINICAL DATA:  Acute onset of bloody stools and epigastric cramping. Initial encounter. EXAM: NUCLEAR MEDICINE GASTROINTESTINAL BLEEDING SCAN TECHNIQUE: Sequential abdominal images were obtained following intravenous administration of Tc-49m labeled red blood cells. RADIOPHARMACEUTICALS:  21.70 mCi Tc-71m in-vitro labeled red cells. COMPARISON:  None. FINDINGS: No abnormal accumulation of activity is visualized to suggest active hemorrhage into small or large bowel. There is normal blood pool and accumulation of activity within the heart, liver and spleen. There is accumulation of activity within the bladder, and also within the penile shaft. IMPRESSION: No evidence for active GI bleeding at this time. Electronically Signed   By: Garald Balding M.D.   On: 06/10/2017 03:25    EKG:   Orders placed or performed during the hospital encounter of 06/09/17  . ED EKG  . ED EKG  . EKG 12-Lead  . EKG 12-Lead    ASSESSMENT  AND PLAN:    This is a 51 year old admitted  for GI bleed.  1. GI bleed:  Bleeding scan shows no evidence of active bleed at this time.  Continue clear liquid diet Seen by gastroenterology scheduled for EGD and colonoscopy tomorrow if UDS is negative Urine drug screen from yesterday and today are positive for cocaine and cannabinoids Continue IV fluids Monitor hemoglobin and hematocrit and transfuse as needed; hemoglobin 10.0-9.6--8.6--9.1  2. Hypokalemia: Replete potassium, potassium at 3.6 today  3. Alcohol abuse: Hydrate with intravenous fluid. Banana bag   CIWA protocol Urine drug screen is positive for cocaine and cannabinoids  4. Overweight: BMI is 25.5; encourage diet and exercise  5. DVT prophylaxis: SCDs  6. GI prophylaxis: Pantoprazole IV every 12 hours    All the records are reviewed and case discussed with Care Management/Social Workerr. Management plans discussed with the patient, family and they are in agreement.  CODE STATUS: FC  TOTAL TIME TAKING CARE OF THIS PATIENT: 35 minutes.   POSSIBLE D/C IN 1-2 DAYS, DEPENDING ON CLINICAL CONDITION.  Note: This dictation was prepared with Dragon dictation along with smaller phrase technology. Any transcriptional errors that result from this process are unintentional.   Nicholes Mango M.D on 06/11/2017 at 2:50 PM  Between 7am to 6pm - Pager - [phone] After 6pm go to [email or web address] - password [email or web address] [facility]  Office  [phone]  CC: Primary care physician; Patient, No Pcp Per

## 2017-06-12 LAB — URINE DRUG SCREEN, QUALITATIVE (ARMC ONLY)
Amphetamines, Ur Screen: NOT DETECTED
BARBITURATES, UR SCREEN: NOT DETECTED
BENZODIAZEPINE, UR SCRN: NOT DETECTED
Cannabinoid 50 Ng, Ur ~~LOC~~: POSITIVE — AB
Cocaine Metabolite,Ur ~~LOC~~: NOT DETECTED
MDMA (Ecstasy)Ur Screen: NOT DETECTED
METHADONE SCREEN, URINE: NOT DETECTED
Opiate, Ur Screen: NOT DETECTED
Phencyclidine (PCP) Ur S: NOT DETECTED
TRICYCLIC, UR SCREEN: NOT DETECTED

## 2017-06-12 LAB — CBC
HCT: 27.2 % — ABNORMAL LOW (ref 40.0–52.0)
HEMOGLOBIN: 9.3 g/dL — AB (ref 13.0–18.0)
MCH: 33.4 pg (ref 26.0–34.0)
MCHC: 34.2 g/dL (ref 32.0–36.0)
MCV: 97.9 fL (ref 80.0–100.0)
Platelets: 216 10*3/uL (ref 150–440)
RBC: 2.78 MIL/uL — AB (ref 4.40–5.90)
RDW: 12.1 % (ref 11.5–14.5)
WBC: 4.9 10*3/uL (ref 3.8–10.6)

## 2017-06-12 LAB — OCCULT BLOOD X 1 CARD TO LAB, STOOL: FECAL OCCULT BLD: POSITIVE — AB

## 2017-06-12 MED ORDER — ADULT MULTIVITAMIN W/MINERALS CH: 1.0000 | ORAL_TABLET | Freq: Every day | ORAL | Status: DC

## 2017-06-12 MED ORDER — PANTOPRAZOLE SODIUM 40 MG PO TBEC: 40.0000 mg | DELAYED_RELEASE_TABLET | Freq: Two times a day (BID) | ORAL | 1 refills | Status: AC

## 2017-06-12 MED ORDER — ACETAMINOPHEN 325 MG PO TABS: 650.0000 mg | ORAL_TABLET | Freq: Four times a day (QID) | ORAL | Status: DC | PRN

## 2017-06-12 MED ORDER — DOCUSATE SODIUM 100 MG PO CAPS: 100.0000 mg | ORAL_CAPSULE | Freq: Every day | ORAL | 0 refills | Status: DC | PRN

## 2017-06-12 NOTE — Discharge Instructions (Signed)
° °  Gastrointestinal Bleeding Gastrointestinal bleeding is bleeding somewhere along the path food travels through the body (digestive tract). This path is anywhere between the mouth and the opening of the butt (anus). You may have blood in your poop (stools) or have black poop. If you throw up (vomit), there may be blood in it. This condition can be mild, serious, or even life-threatening. If you have a lot of bleeding, you may need to stay in the hospital. Follow these instructions at home:  Take over-the-counter and prescription medicines only as told by your doctor.  Eat foods that have a lot of fiber in them. These foods include whole grains, fruits, and vegetables. You can also try eating 1-3 prunes each day.  Drink enough fluid to keep your pee (urine) clear or pale yellow.  Keep all follow-up visits as told by your doctor. This is important. Contact a doctor if:  Your symptoms do not get better. Get help right away if:  Your bleeding gets worse.  You feel dizzy or you pass out (faint).  You feel weak.  You have very bad cramps in your back or belly (abdomen).  You pass large clumps of blood (clots) in your poop.  Your symptoms are getting worse. This information is not intended to replace advice given to you by your health care provider. Make sure you discuss any questions you have with your health care provider. Document Released: 05/27/2008 Document Revised: 01/24/2016 Document Reviewed: 02/05/2015 Elsevier Interactive Patient Education  2018 [place] with primary care physician or [facility] in one week Follow-up with gastroenterology doctor dr.Anna in a week

## 2017-06-12 NOTE — Care Management Note (Signed)
Case Management Note  Patient Details  Name: Matthew Nolan MRN: [id number] Date of Birth: 1965/12/20  Patient admitted for GI bleed.  Patient cocaine positive on admission.  Patient self pay. Does not have PCP.  Patient provided with coupons from goodrx.com for Protonix which he will discharge with.  Patient provided with application to [facility], Medication Management, and "The Network:  Your Guide to [employer] and [employer] in [facility]"  Booklet.  RNCM signing off  Subjective/Objective:                    Action/Plan:   Expected Discharge Date:  06/12/17               Expected Discharge Plan:  Home/Self Care  In-House Referral:     Discharge planning Services  CM Consult, Medication Assistance, [facility]  Post Acute Care Choice:    Choice offered to:     DME Arranged:    DME Agency:     HH Arranged:    HH Agency:     Status of Service:  Completed, signed off  If discussed at [employer], dates discussed:    Additional Comments:  Beverly Sessions, RN 06/12/2017, 1:36 PM

## 2017-06-12 NOTE — Discharge Summary (Signed)
[facility] [place] NAME: Matthew Nolan    MR#:  [id number]  DATE OF BIRTH:  [birth year]  DATE OF ADMISSION:  06/09/2017 ADMITTING PHYSICIAN: Harrie Foreman, MD  DATE OF DISCHARGE: 06/12/17  PRIMARY CARE PHYSICIAN: Patient, No Pcp Per    ADMISSION DIAGNOSIS:  Rectal bleeding [K62.5] Near syncope [R55]  DISCHARGE DIAGNOSIS:  Principal Problem:   GI bleed Active Problems:   Lower GI bleed   SECONDARY DIAGNOSIS:   Past Medical History:  Diagnosis Date  . Alcohol abuse   . Tobacco abuse     HOSPITAL COURSE:  HPI: The patient with past medical history of alcohol and tobacco abuse presents emergency department with bright red bleeding per rectum. The patient had 2 bowel movements both of which were grossly bloody. The second bowel movement also had a heavy burden of clot. The patient underwent a bleeding scan in the emergency department and gastroenterology has agreed to see him. He is hemodynamically stable. He reports drinking at least 6 beers per day. The hospitalist service was contacted for admission and medical management.  1. GI bleed:  Bleeding scan shows no evidence of active bleed at this time.  Patient tolerated clear liquid diet and advance diet cheduled for EGD and colonoscopy but urine drug screen being positive today and the past 2 days it was canceled Monitor hemoglobin and hematocrit and transfuse as needed; hemoglobin 10.0-9.6--8.6--9.1-9.3   no active bleeding at this time. Will discharge patient home for outpatient follow-up with gastroenterology for EGD and colonoscopy next week. Patient is agreeable  2. Hypokalemia: Replete potassium, potassium at 3.6   3. Alcohol abuse: Hydrated with intravenous fluids Banana bag   CIWA protocol implemented during the hospital course Urine drug screen is positive for cocaine and cannabinoids  4. Overweight: BMI is 25.5; encourage diet and exercise  5. DVT  prophylaxis: SCDs  6. GI prophylaxis: Pantoprazole IV every 12 hoursGiven during the hospital course. Will discharge him with by mouth Protonix    DISCHARGE CONDITIONS:   Stable   CONSULTS OBTAINED:  Treatment Team:  Jonathon Bellows, MD Lucilla Lame, MD   PROCEDURES None   DRUG ALLERGIES:  No Known Allergies  DISCHARGE MEDICATIONS:   Current Discharge Medication List    START taking these medications   Details  acetaminophen (TYLENOL) 325 MG tablet Take 2 tablets (650 mg total) by mouth every 6 (six) hours as needed for mild pain (or Fever >/= 101).    docusate sodium (COLACE) 100 MG capsule Take 1 capsule (100 mg total) by mouth daily as needed for mild constipation. Qty: 10 capsule, Refills: 0    Multiple Vitamin (MULTIVITAMIN WITH MINERALS) TABS tablet Take 1 tablet by mouth daily.    pantoprazole (PROTONIX) 40 MG tablet Take 1 tablet (40 mg total) by mouth 2 (two) times daily before a meal. Qty: 60 tablet, Refills: 1         DISCHARGE INSTRUCTIONS:    Follow-up with primary care physician or [facility] in one week Follow-up with gastroenterology doctor dr.Anna in a week  DIET:  Regular diet  DISCHARGE CONDITION:  Stable  ACTIVITY:  Activity as tolerated  OXYGEN:  Home Oxygen: No.   Oxygen Delivery: room air  DISCHARGE LOCATION:  home   If you experience worsening of your admission symptoms, develop shortness of breath, life threatening emergency, suicidal or homicidal thoughts you must seek medical attention immediately by calling 911 or calling your MD immediately  if symptoms less severe.  You Must read complete instructions/literature along with all the possible adverse reactions/side effects for all the Medicines you take and that have been prescribed to you. Take any new Medicines after you have completely understood and accpet all the possible adverse reactions/side effects.   Please note  You were cared for by a hospitalist  during your hospital stay. If you have any questions about your discharge medications or the care you received while you were in the hospital after you are discharged, you can call the unit and asked to speak with the hospitalist on call if the hospitalist that took care of you is not available. Once you are discharged, your primary care physician will handle any further medical issues. Please note that NO REFILLS for any discharge medications will be authorized once you are discharged, as it is imperative that you return to your primary care physician (or establish a relationship with a primary care physician if you do not have one) for your aftercare needs so that they can reassess your need for medications and monitor your lab values.     Today  Chief Complaint  Patient presents with  . Rectal Bleeding   Patient denies any other episodes of bleeding, reports dark stool but not black and tarry stool. Denies any abdominal pain. Hemoglobin is stable. Wants to go home. Discussed with the gastroenterology, recommending outpatient follow-up next week  ROS:  CONSTITUTIONAL: Denies fevers, chills. Denies any fatigue, weakness.  EYES: Denies blurry vision, double vision, eye pain. EARS, NOSE, THROAT: Denies tinnitus, ear pain, hearing loss. RESPIRATORY: Denies cough, wheeze, shortness of breath.  CARDIOVASCULAR: Denies chest pain, palpitations, edema.  GASTROINTESTINAL: Denies nausea, vomiting, diarrhea, abdominal pain. Denies bright red blood per rectum. GENITOURINARY: Denies dysuria, hematuria. ENDOCRINE: Denies nocturia or thyroid problems. HEMATOLOGIC AND LYMPHATIC: Denies easy bruising or bleeding. SKIN: Denies rash or lesion. MUSCULOSKELETAL: Denies pain in neck, back, shoulder, knees, hips or arthritic symptoms.  NEUROLOGIC: Denies paralysis, paresthesias.  PSYCHIATRIC: Denies anxiety or depressive symptoms.   VITAL SIGNS:  Blood pressure 136/75, pulse 73, temperature 98.5 F (36.9 C),  temperature source Oral, resp. rate 15, height 5\' 8"  (1.727 m), weight 70.8 kg (156 lb), SpO2 100 %.  I/O:    Intake/Output Summary (Last 24 hours) at 06/12/17 1216 Last data filed at 06/12/17 0839  Gross per 24 hour  Intake          1431.33 ml  Output             3125 ml  Net         -1693.67 ml    PHYSICAL EXAMINATION:  GENERAL:  51 y.o.-year-old patient lying in the bed with no acute distress.  EYES: Pupils equal, round, reactive to light and accommodation. No scleral icterus. Extraocular muscles intact.  HEENT: Head atraumatic, normocephalic. Oropharynx and nasopharynx clear.  NECK:  Supple, no jugular venous distention. No thyroid enlargement, no tenderness.  LUNGS: Normal breath sounds bilaterally, no wheezing, rales,rhonchi or crepitation. No use of accessory muscles of respiration.  CARDIOVASCULAR: S1, S2 normal. No murmurs, rubs, or gallops.  ABDOMEN: Soft, non-tender, non-distended. Bowel sounds present. No organomegaly or mass.  EXTREMITIES: No pedal edema, cyanosis, or clubbing.  NEUROLOGIC: Cranial nerves II through XII are intact. Muscle strength 5/5 in all extremities. Sensation intact. Gait not checked.  PSYCHIATRIC: The patient is alert and oriented x 3.  SKIN: No obvious rash, lesion, or ulcer.   DATA REVIEW:   CBC  Recent Labs  Lab 06/12/17 0336  WBC 4.9  HGB 9.3*  HCT 27.2*  PLT 216    Chemistries   Recent Labs Lab 06/09/17 2157 06/11/17 0352  NA 134* 137  K 3.1* 3.6  CL 104 108  CO2 20* 26  GLUCOSE 139* 94  BUN 9 7  CREATININE 1.20 1.06  CALCIUM 8.0* 8.5*  AST 23  --   ALT 13*  --   ALKPHOS 58  --   BILITOT 0.6  --     Cardiac Enzymes  Recent Labs Lab 06/09/17 2157  TROPONINI <0.03    Microbiology Results  No results found for this or any previous visit.  RADIOLOGY:  Nm Gi Blood Loss  Result Date: 06/10/2017 CLINICAL DATA:  Acute onset of bloody stools and epigastric cramping. Initial encounter. EXAM: NUCLEAR MEDICINE  GASTROINTESTINAL BLEEDING SCAN TECHNIQUE: Sequential abdominal images were obtained following intravenous administration of Tc-88m labeled red blood cells. RADIOPHARMACEUTICALS:  21.70 mCi Tc-37m in-vitro labeled red cells. COMPARISON:  None. FINDINGS: No abnormal accumulation of activity is visualized to suggest active hemorrhage into small or large bowel. There is normal blood pool and accumulation of activity within the heart, liver and spleen. There is accumulation of activity within the bladder, and also within the penile shaft. IMPRESSION: No evidence for active GI bleeding at this time. Electronically Signed   By: Garald Balding M.D.   On: 06/10/2017 03:25    EKG:   Orders placed or performed during the hospital encounter of 06/09/17  . ED EKG  . ED EKG  . EKG 12-Lead  . EKG 12-Lead      Management plans discussed with the patient, family and they are in agreement.  CODE STATUS:     Code Status Orders        Start     Ordered   06/10/17 0325  Full code  Continuous     06/10/17 0324    Code Status History    Date Active Date Inactive Code Status Order ID Comments User Context   This patient has a current code status but no historical code status.      TOTAL TIME TAKING CARE OF THIS PATIENT: 45  minutes.   Note: This dictation was prepared with Dragon dictation along with smaller phrase technology. Any transcriptional errors that result from this process are unintentional.   @[facility] @  on 06/12/2017 at 12:16 PM  Between 7am to 6pm - Pager - [phone]  After 6pm go to [email or web address] - password [email or web address] [facility]  Office  [phone]  CC: Primary care physician; Patient, No Pcp Per

## 2017-06-12 NOTE — Progress Notes (Signed)
Patient discharge teaching given, including activity, diet, follow-up appoints, medications, and coupons. Patient verbalized understanding of all discharge instructions. IV access was d/c'd. Vitals are stable. Skin is intact except as charted in most recent assessments. Pt refused to be escorted out, to be driven home by family.  Khai Torbert [employer]

## 2017-06-23 ENCOUNTER — Encounter: Payer: Self-pay | Admitting: *Deleted

## 2017-06-23 ENCOUNTER — Ambulatory Visit: Payer: Self-pay | Admitting: Gastroenterology

## 2017-06-24 ENCOUNTER — Other Ambulatory Visit
Admission: RE | Admit: 2017-06-24 | Discharge: 2017-06-24 | Disposition: A | Discharge status: Home / Self Care | Payer: Self-pay | Source: Ambulatory Visit | Attending: Gastroenterology | Admitting: Gastroenterology

## 2017-06-24 ENCOUNTER — Telehealth: Payer: Self-pay | Admitting: Gastroenterology

## 2017-06-24 ENCOUNTER — Telehealth: Payer: Self-pay

## 2017-06-24 ENCOUNTER — Other Ambulatory Visit: Payer: Self-pay

## 2017-06-24 DIAGNOSIS — K625 Hemorrhage of anus and rectum: Secondary | ICD-10-CM

## 2017-06-24 DIAGNOSIS — Z01812 Encounter for preprocedural laboratory examination: Secondary | ICD-10-CM

## 2017-06-24 LAB — CBC WITH DIFFERENTIAL/PLATELET
BASOS PCT: 0 %
Basophils Absolute: 0 10*3/uL (ref 0–0.1)
Eosinophils Absolute: 0.1 10*3/uL (ref 0–0.7)
Eosinophils Relative: 2 %
HEMATOCRIT: 33.5 % — AB (ref 40.0–52.0)
HEMOGLOBIN: 11.1 g/dL — AB (ref 13.0–18.0)
LYMPHS ABS: 1.7 10*3/uL (ref 1.0–3.6)
Lymphocytes Relative: 29 %
MCH: 32.8 pg (ref 26.0–34.0)
MCHC: 33.2 g/dL (ref 32.0–36.0)
MCV: 98.7 fL (ref 80.0–100.0)
MONO ABS: 0.5 10*3/uL (ref 0.2–1.0)
Monocytes Relative: 8 %
NEUTROS ABS: 3.7 10*3/uL (ref 1.4–6.5)
NEUTROS PCT: 61 %
Platelets: 353 10*3/uL (ref 150–440)
RBC: 3.39 MIL/uL — ABNORMAL LOW (ref 4.40–5.90)
RDW: 12.3 % (ref 11.5–14.5)
WBC: 6.1 10*3/uL (ref 3.8–10.6)

## 2017-06-24 LAB — COMPREHENSIVE METABOLIC PANEL
ALBUMIN: 4 g/dL (ref 3.5–5.0)
ALK PHOS: 72 U/L (ref 38–126)
ALT: 25 U/L (ref 17–63)
ANION GAP: 8 (ref 5–15)
AST: 29 U/L (ref 15–41)
BUN: 10 mg/dL (ref 6–20)
CHLORIDE: 101 mmol/L (ref 101–111)
CO2: 28 mmol/L (ref 22–32)
Calcium: 9.3 mg/dL (ref 8.9–10.3)
Creatinine, Ser: 1.04 mg/dL (ref 0.61–1.24)
GFR calc Af Amer: >60 mL/min (ref >60)
GFR calc non Af Amer: >60 mL/min (ref >60)
GLUCOSE: 88 mg/dL (ref 65–99)
POTASSIUM: 4.2 mmol/L (ref 3.5–5.1)
SODIUM: 137 mmol/L (ref 135–145)
Total Bilirubin: 0.5 mg/dL (ref 0.3–1.2)
Total Protein: 7.7 g/dL (ref 6.5–8.1)

## 2017-06-24 LAB — URINE DRUG SCREEN, QUALITATIVE (ARMC ONLY)
AMPHETAMINES, UR SCREEN: NOT DETECTED
BENZODIAZEPINE, UR SCRN: NOT DETECTED
Barbiturates, Ur Screen: NOT DETECTED
Cannabinoid 50 Ng, Ur ~~LOC~~: POSITIVE — AB
Cocaine Metabolite,Ur ~~LOC~~: NOT DETECTED
MDMA (ECSTASY) UR SCREEN: NOT DETECTED
METHADONE SCREEN, URINE: NOT DETECTED
OPIATE, UR SCREEN: NOT DETECTED
Phencyclidine (PCP) Ur S: NOT DETECTED
Tricyclic, Ur Screen: NOT DETECTED

## 2017-06-24 LAB — PROTIME-INR
INR: 0.97
Prothrombin Time: 12.8 seconds (ref 11.4–15.2)

## 2017-06-24 NOTE — Telephone Encounter (Signed)
Advised patient to have labs drawn today. Dr. Vicente Males wants results for OV appt.   Scheduled patient for 1130am tomorrow.   Released labs to [facility].

## 2017-06-24 NOTE — Telephone Encounter (Signed)
Left a voice message for patient to call and reschedule his no show appt.

## 2017-06-25 ENCOUNTER — Encounter: Payer: Self-pay | Admitting: Gastroenterology

## 2017-06-25 ENCOUNTER — Ambulatory Visit (INDEPENDENT_AMBULATORY_CARE_PROVIDER_SITE_OTHER): Payer: Self-pay | Admitting: Gastroenterology

## 2017-06-25 VITALS — BP 133/87 | HR 69 | Temp 97.8°F | Ht 69.0 in | Wt 166.0 lb

## 2017-06-25 DIAGNOSIS — K625 Hemorrhage of anus and rectum: Secondary | ICD-10-CM

## 2017-06-25 MED ORDER — HYDROCORTISONE ACETATE 25 MG RE SUPP: 25.0000 mg | Freq: Two times a day (BID) | RECTAL | 1 refills | Status: DC

## 2017-06-25 MED ORDER — HYDROCORTISONE ACETATE 25 MG RE SUPP: 25.0000 mg | Freq: Every day | RECTAL | 0 refills | Status: AC

## 2017-06-25 NOTE — Progress Notes (Signed)
Jonathon Bellows MD, MRCP(U.K) [address]  [place]  [place], [place] [address]  Main: [phone]  Fax: [phone]   Primary Care Physician: Patient, No Pcp Per  Primary Gastroenterologist:  Dr. Jonathon Bellows   No chief complaint on file.    HPI: Matthew Nolan is a 51 y.o. male   Summary of history:  He is here today to see me for a hospital follow up visit.I was consulted to see him when he was recently admitted on 06/10/17 for a  rectal bleeding. BUN/Cr was not elevated suggestive of a more likely lower GI bleed , it was also possible he had a brisk upper bleed . More likely a diverticular bleed or ischemic colitis from use of cocaine . Bleeding scan shows no evidence of active bleed at this time. Could not perform inpatient procedures due to cocaine in urine. Plan was for OP procedures as he was stable at discharge. H/o alcohol abuse .    Interval history   06/12/2017-  06/25/2017    CBC Latest Ref Rng & Units 06/24/2017 06/12/2017 06/11/2017  WBC 3.8 - 10.6 K/uL 6.1 4.9 4.3  Hemoglobin 13.0 - 18.0 g/dL 11.1(L) 9.3(L) 9.1(L)  Hematocrit 40.0 - 52.0 % 33.5(L) 27.2(L) 26.3(L)  Platelets 150 - 440 K/uL 353 216 187   Still has rectal bleeding , bright red, after every bowel movement , "little" on the toilet , no anal itching . No NSAID's. No alcohol or drug use. Has quit he says.      Current Outpatient Prescriptions  Medication Sig Dispense Refill  . acetaminophen (TYLENOL) 325 MG tablet Take 2 tablets (650 mg total) by mouth every 6 (six) hours as needed for mild pain (or Fever >/= 101).    Marland Kitchen docusate sodium (COLACE) 100 MG capsule Take 1 capsule (100 mg total) by mouth daily as needed for mild constipation. 10 capsule 0  . hydrocortisone (ANUSOL-HC) 25 MG suppository Place 1 suppository (25 mg total) rectally daily. 5 suppository 0  . Multiple Vitamin (MULTIVITAMIN WITH MINERALS) TABS tablet Take 1 tablet by mouth daily.    . pantoprazole (PROTONIX) 40 MG tablet  Take 1 tablet (40 mg total) by mouth 2 (two) times daily before a meal. 60 tablet 1   No current facility-administered medications for this visit.     Allergies as of 06/25/2017  . (No Known Allergies)    ROS:  General: Negative for anorexia, weight loss, fever, chills, fatigue, weakness. ENT: Negative for hoarseness, difficulty swallowing , nasal congestion. CV: Negative for chest pain, angina, palpitations, dyspnea on exertion, peripheral edema.  Respiratory: Negative for dyspnea at rest, dyspnea on exertion, cough, sputum, wheezing.  GI: See history of present illness. GU:  Negative for dysuria, hematuria, urinary incontinence, urinary frequency, nocturnal urination.  Endo: Negative for unusual weight change.    Physical Examination:   There were no vitals taken for this visit.  General: Well-nourished, well-developed in no acute distress.  Eyes: No icterus. Conjunctivae pink. Mouth: Oropharyngeal mucosa moist and pink , no lesions erythema or exudate. Lungs: Clear to auscultation bilaterally. Non-labored. Heart: Regular rate and rhythm, no murmurs rubs or gallops.  Abdomen: Bowel sounds are normal, nontender, nondistended, no hepatosplenomegaly or masses, no abdominal bruits or hernia , no rebound or guarding.   Extremities: No lower extremity edema. No clubbing or deformities. Neuro: Alert and oriented x 3.  Grossly intact. Skin: Warm and dry, no jaundice.   Psych: Alert and cooperative, normal mood and affect.  Imaging Studies: Nm Gi Blood Loss  Result Date: 06/10/2017 CLINICAL DATA:  Acute onset of bloody stools and epigastric cramping. Initial encounter. EXAM: NUCLEAR MEDICINE GASTROINTESTINAL BLEEDING SCAN TECHNIQUE: Sequential abdominal images were obtained following intravenous administration of Tc-78m labeled red blood cells. RADIOPHARMACEUTICALS:  21.70 mCi Tc-92m in-vitro labeled red cells. COMPARISON:  None. FINDINGS: No abnormal accumulation of activity is  visualized to suggest active hemorrhage into small or large bowel. There is normal blood pool and accumulation of activity within the heart, liver and spleen. There is accumulation of activity within the bladder, and also within the penile shaft. IMPRESSION: No evidence for active GI bleeding at this time. Electronically Signed   By: Garald Balding M.D.   On: 06/10/2017 03:25    Assessment and Plan:   Matthew Nolan is a 51 y.o. y/o male for rectal bleeding . Stopped all illegal drug use  Plan  1. Stop all alcohol and illegal drug use 2. EGD+colonoscopy  3. Urine drug screen on the day of the procedure 4. Anusol trial for possible inflammed hemorroids. .    I have discussed alternative options, risks & benefits,  which include, but are not limited to, bleeding, infection, perforation,respiratory complication & drug reaction.  The patient agrees with this plan & written consent will be obtained.    Dr Jonathon Bellows  MD,MRCP [facility]) Follow up in 8 weeks

## 2017-06-29 ENCOUNTER — Encounter: Payer: Self-pay | Admitting: *Deleted

## 2017-06-30 ENCOUNTER — Encounter: Payer: Self-pay | Admitting: *Deleted

## 2017-06-30 ENCOUNTER — Ambulatory Visit: Payer: Self-pay | Admitting: Anesthesiology

## 2017-06-30 ENCOUNTER — Ambulatory Visit
Admission: RE | Admit: 2017-06-30 | Discharge: 2017-06-30 | Disposition: A | Discharge status: Home / Self Care | Payer: Self-pay | Source: Ambulatory Visit | Attending: Gastroenterology | Admitting: Gastroenterology

## 2017-06-30 ENCOUNTER — Encounter
Admission: RE | Disposition: A | Discharge status: Home / Self Care | Payer: Self-pay | Source: Ambulatory Visit | Attending: Gastroenterology

## 2017-06-30 DIAGNOSIS — D123 Benign neoplasm of transverse colon: Secondary | ICD-10-CM

## 2017-06-30 DIAGNOSIS — K921 Melena: Secondary | ICD-10-CM | POA: Insufficient documentation

## 2017-06-30 DIAGNOSIS — K621 Rectal polyp: Secondary | ICD-10-CM | POA: Insufficient documentation

## 2017-06-30 DIAGNOSIS — K625 Hemorrhage of anus and rectum: Secondary | ICD-10-CM

## 2017-06-30 DIAGNOSIS — K640 First degree hemorrhoids: Secondary | ICD-10-CM | POA: Insufficient documentation

## 2017-06-30 DIAGNOSIS — K635 Polyp of colon: Secondary | ICD-10-CM | POA: Insufficient documentation

## 2017-06-30 DIAGNOSIS — F17200 Nicotine dependence, unspecified, uncomplicated: Secondary | ICD-10-CM | POA: Insufficient documentation

## 2017-06-30 HISTORY — PX: COLONOSCOPY WITH PROPOFOL: SHX5780

## 2017-06-30 HISTORY — PX: ESOPHAGOGASTRODUODENOSCOPY (EGD) WITH PROPOFOL: SHX5813

## 2017-06-30 LAB — URINE DRUG SCREEN, QUALITATIVE (ARMC ONLY)
Amphetamines, Ur Screen: NOT DETECTED
Barbiturates, Ur Screen: NOT DETECTED
Benzodiazepine, Ur Scrn: NOT DETECTED
CANNABINOID 50 NG, UR ~~LOC~~: POSITIVE — AB
Cocaine Metabolite,Ur ~~LOC~~: NOT DETECTED
MDMA (ECSTASY) UR SCREEN: NOT DETECTED
Methadone Scn, Ur: NOT DETECTED
OPIATE, UR SCREEN: NOT DETECTED
PHENCYCLIDINE (PCP) UR S: NOT DETECTED
Tricyclic, Ur Screen: NOT DETECTED

## 2017-06-30 SURGERY — COLONOSCOPY WITH PROPOFOL
Anesthesia: General

## 2017-06-30 MED ORDER — PROPOFOL 500 MG/50ML IV EMUL
INTRAVENOUS | Status: DC | PRN
  Administered 2017-06-30: 175 ug/kg/min via INTRAVENOUS

## 2017-06-30 MED ORDER — LIDOCAINE HCL (CARDIAC) 20 MG/ML IV SOLN
INTRAVENOUS | Status: DC | PRN
  Administered 2017-06-30: 60 mg via INTRAVENOUS

## 2017-06-30 MED ORDER — MIDAZOLAM HCL 2 MG/2ML IJ SOLN
INTRAMUSCULAR | Status: AC
  Filled 2017-06-30: qty 2

## 2017-06-30 MED ORDER — MIDAZOLAM HCL 2 MG/2ML IJ SOLN
INTRAMUSCULAR | Status: DC | PRN
  Administered 2017-06-30: 2 mg via INTRAVENOUS

## 2017-06-30 MED ORDER — PROPOFOL 500 MG/50ML IV EMUL
INTRAVENOUS | Status: AC
  Filled 2017-06-30: qty 50

## 2017-06-30 MED ORDER — GLYCOPYRROLATE 0.2 MG/ML IJ SOLN
INTRAMUSCULAR | Status: AC
  Filled 2017-06-30: qty 1

## 2017-06-30 MED ORDER — PROPOFOL 10 MG/ML IV BOLUS
INTRAVENOUS | Status: AC
  Filled 2017-06-30: qty 20

## 2017-06-30 MED ORDER — SODIUM CHLORIDE 0.9 % IV SOLN
INTRAVENOUS | Status: DC
  Administered 2017-06-30 (×3): via INTRAVENOUS

## 2017-06-30 MED ORDER — GLYCOPYRROLATE 0.2 MG/ML IJ SOLN
INTRAMUSCULAR | Status: DC | PRN
  Administered 2017-06-30: 0.2 mg via INTRAVENOUS

## 2017-06-30 MED ORDER — LIDOCAINE HCL (PF) 2 % IJ SOLN
INTRAMUSCULAR | Status: AC
  Filled 2017-06-30: qty 10

## 2017-06-30 MED ORDER — PROPOFOL 10 MG/ML IV BOLUS
INTRAVENOUS | Status: DC | PRN
  Administered 2017-06-30: 50 mg via INTRAVENOUS

## 2017-06-30 NOTE — Transfer of Care (Signed)
Immediate Anesthesia Transfer of Care Note  Patient: Matthew Nolan  Procedure(s) Performed: COLONOSCOPY WITH PROPOFOL (N/A ) ESOPHAGOGASTRODUODENOSCOPY (EGD) WITH PROPOFOL (N/A )  Patient Location: Endoscopy Unit  Anesthesia Type:General  Level of Consciousness: drowsy and patient cooperative  Airway & Oxygen Therapy: Patient Spontanous Breathing and Patient connected to nasal cannula oxygen  Post-op Assessment: Report given to RN and Post -op Vital signs reviewed and stable  Post vital signs: Reviewed and stable  Last Vitals:  Vitals:   06/30/17 0903 06/30/17 1124  BP: 130/78 101/65  Pulse: 70 88  Resp: 20 (!) 29  Temp: 36.7 C (!) 35.7 C  SpO2: 100% 100%    Last Pain:  Vitals:   06/30/17 1124  TempSrc: Tympanic         Complications: No apparent anesthesia complications

## 2017-06-30 NOTE — Anesthesia Postprocedure Evaluation (Signed)
Anesthesia Post Note  Patient: Matthew Nolan  Procedure(s) Performed: COLONOSCOPY WITH PROPOFOL (N/A ) ESOPHAGOGASTRODUODENOSCOPY (EGD) WITH PROPOFOL (N/A )  Patient location during evaluation: Endoscopy Anesthesia Type: General Level of consciousness: awake and alert Pain management: pain level controlled Vital Signs Assessment: post-procedure vital signs reviewed and stable Respiratory status: spontaneous breathing and respiratory function stable Cardiovascular status: stable Anesthetic complications: no     Last Vitals:  Vitals:   06/30/17 0903 06/30/17 1124  BP: 130/78 101/65  Pulse: 70 88  Resp: 20 (!) 29  Temp: 36.7 C (!) 35.7 C  SpO2: 100% 100%    Last Pain:  Vitals:   06/30/17 1124  TempSrc: Tympanic                 KEPHART,WILLIAM K

## 2017-06-30 NOTE — Op Note (Addendum)
[facility] Gastroenterology Patient Name: Matthew Nolan Procedure Date: 06/30/2017 10:46 AM MRN: [id number] Account #: [id number] Date of Birth: Jan 24, 1966 Admit Type: Outpatient Age: 51 Room: [facility] ENDO ROOM 1 Gender: Male Note Status: Finalized Procedure:            Upper GI endoscopy Indications:          Melena Providers:            Jonathon Bellows MD, MD Referring MD:         No Local Md, MD (Referring MD) Medicines:            Monitored Anesthesia Care Complications:        No immediate complications. Procedure:            Pre-Anesthesia Assessment:                       - Prior to the procedure, a History and Physical was                        performed, and patient medications, allergies and                        sensitivities were reviewed. The patient's tolerance of                        previous anesthesia was reviewed.                       - The risks and benefits of the procedure and the                        sedation options and risks were discussed with the                        patient. All questions were answered and informed                        consent was obtained.                       - ASA Grade Assessment: III - A patient with severe                        systemic disease.                       After obtaining informed consent, the endoscope was                        passed under direct vision. Throughout the procedure,                        the patient's blood pressure, pulse, and oxygen                        saturations were monitored continuously. The Endoscope                        was introduced through the mouth, and advanced to the  third part of duodenum. The upper GI endoscopy was                        accomplished with ease. The patient tolerated the                        procedure well. Findings:      The esophagus was normal.      The stomach was normal.      The examined duodenum was  normal. Impression:           - Normal esophagus.                       - Normal stomach.                       - Normal examined duodenum.                       - No specimens collected. Recommendation:       - Discharge patient to home (with escort).                       - Advance diet as tolerated.                       - Perform a colonoscopy today. Procedure Code(s):    --- Professional ---                       [phone], Esophagogastroduodenoscopy, flexible, transoral;                        diagnostic, including collection of specimen(s) by                        brushing or washing, when performed (separate procedure) Diagnosis Code(s):    --- Professional ---                       K92.1, Melena (includes Hematochezia) CPT copyright 2016 American Medical Association. All rights reserved. The codes documented in this report are preliminary and upon coder review may  be revised to meet current compliance requirements. Jonathon Bellows, MD Jonathon Bellows MD, MD 06/30/2017 10:56:57 AM This report has been signed electronically. Number of Addenda: 0 Note Initiated On: 06/30/2017 10:46 AM      [facility]

## 2017-06-30 NOTE — H&P (Signed)
     Jonathon Bellows, MD [address] [place], [place], [place], [address] [address], [place], [place], [place], [address] Phone: [phone]  Fax: [phone]  Primary Care Physician:  Patient, No Pcp Per   Pre-Procedure History & Physical: HPI:  Matthew Nolan is a 51 y.o. male is here for an endoscopy and colonoscopy    Past Medical History:  Diagnosis Date  . Alcohol abuse   . Tobacco abuse     Past Surgical History:  Procedure Laterality Date  . NO PAST SURGERIES      Prior to Admission medications   Medication Sig Start Date End Date Taking? Authorizing Provider  acetaminophen (TYLENOL) 325 MG tablet Take 2 tablets (650 mg total) by mouth every 6 (six) hours as needed for mild pain (or Fever >/= 101). 06/12/17  Yes Gouru, Illene Silver, MD  docusate sodium (COLACE) 100 MG capsule Take 1 capsule (100 mg total) by mouth daily as needed for mild constipation. 06/12/17  Yes Gouru, Illene Silver, MD  hydrocortisone (ANUSOL-HC) 25 MG suppository Place 1 suppository (25 mg total) rectally daily. 06/25/17 07/01/17 Yes Jonathon Bellows, MD  pantoprazole (PROTONIX) 40 MG tablet Take 1 tablet (40 mg total) by mouth 2 (two) times daily before a meal. 06/12/17 06/12/18 Yes Gouru, Aruna, MD  hydrocortisone (ANUSOL-HC) 25 MG suppository Place 1 suppository (25 mg total) rectally every 12 (twelve) hours. 06/25/17   Jonathon Bellows, MD  Multiple Vitamin (MULTIVITAMIN WITH MINERALS) TABS tablet Take 1 tablet by mouth daily. Patient not taking: Reported on 06/25/2017 06/13/17   Nicholes Mango, MD    Allergies as of 06/25/2017  . (No Known Allergies)    Family History  Problem Relation Age of Onset  . Family history unknown: Yes    Social History   Social History  . Marital status: Single    Spouse name: N/A  . Number of children: N/A  . Years of education: N/A   Occupational History  . Not on file.   Social History Main Topics  . Smoking status: Current Every Day Smoker  . Smokeless tobacco:  Never Used  . Alcohol use 25.2 oz/week    42 Cans of beer per week  . Drug use: No  . Sexual activity: Yes   Other Topics Concern  . Not on file   Social History Narrative  . No narrative on file    Review of Systems: See HPI, otherwise negative ROS  Physical Exam: BP 130/78   Pulse 70   Temp 98.1 F (36.7 C) (Tympanic)   Resp 20   Ht 5\' 8"  (1.727 m)   Wt 166 lb (75.3 kg)   SpO2 100%   BMI 25.24 kg/m  General:   Alert,  pleasant and cooperative in NAD Head:  Normocephalic and atraumatic. Neck:  Supple; no masses or thyromegaly. Lungs:  Clear throughout to auscultation, normal respiratory effort.    Heart:  +S1, +S2, Regular rate and rhythm, No edema. Abdomen:  Soft, nontender and nondistended. Normal bowel sounds, without guarding, and without rebound.   Neurologic:  Alert and  oriented x4;  grossly normal neurologically.  Impression/Plan: [place] is here for an colonoscopy to be performed for an endoscopy and colonoscopy  being performed for evaluation of GI bleed     Risks, benefits, limitations, and alternatives regarding endoscopy have been reviewed with the patient.  Questions have been answered.  All parties agreeable.   Jonathon Bellows, MD  06/30/2017, 9:29 AM

## 2017-06-30 NOTE — Anesthesia Post-op Follow-up Note (Signed)
Anesthesia QCDR form completed.        

## 2017-06-30 NOTE — Op Note (Addendum)
[facility] Patient Name: Matthew Nolan Procedure Date: 06/30/2017 10:45 AM MRN: [id number] Account #: [id number] Date of Birth: 04/04/1966 Admit Type: Outpatient Age: 51 Room: [facility] ENDO ROOM 1 Gender: Male Note Status: Finalized Procedure:            Colonoscopy Indications:          Melena Providers:            Jonathon Bellows MD, MD Referring MD:         No Local Md, MD (Referring MD) Medicines:            Monitored Anesthesia Care Complications:        No immediate complications. Procedure:            Pre-Anesthesia Assessment:                       - Prior to the procedure, a History and Physical was                        performed, and patient medications, allergies and                        sensitivities were reviewed. The patient's tolerance of                        previous anesthesia was reviewed.                       - The risks and benefits of the procedure and the                        sedation options and risks were discussed with the                        patient. All questions were answered and informed                        consent was obtained.                       - ASA Grade Assessment: II - A patient with mild                        systemic disease.                       After obtaining informed consent, the colonoscope was                        passed under direct vision. Throughout the procedure,                        the patient's blood pressure, pulse, and oxygen                        saturations were monitored continuously. The                        Colonoscope was introduced through the anus and                        advanced to  the the cecum, identified by the                        appendiceal orifice, IC valve and transillumination.                        The colonoscopy was performed with ease. The patient                        tolerated the procedure well. The quality of the bowel     preparation was good. Findings:      Two sessile polyps were found in the rectum and transverse colon. The       polyps were 4 to 6 mm in size. These polyps were removed with a cold       snare. Resection and retrieval were complete.      Non-bleeding internal hemorrhoids were found during retroflexion. The       hemorrhoids were medium-sized and Grade I (internal hemorrhoids that do       not prolapse).      The exam was otherwise without abnormality on direct and retroflexion       views. Impression:           - Two 4 to 6 mm polyps in the rectum and in the                        transverse colon, removed with a cold snare. Resected                        and retrieved.                       - Non-bleeding internal hemorrhoids.                       - The examination was otherwise normal on direct and                        retroflexion views. Recommendation:       - Discharge patient to home (with escort).                       - Resume previous diet.                       - Continue present medications.                       - Await pathology results.                       - Repeat colonoscopy for surveillance based on                        pathology results.                       - 1. Advise to stay off cocaine. 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                       2. Follow up with PCP and if there is concern for                        recurrence of a GI bleed with drop in hemoglobin then                        refer back to me for capsule study of small bowel Procedure Code(s):    --- Professional ---                       [phone], Colonoscopy, flexible; with removal of tumor(s),                        polyp(s), or other lesion(s) by snare technique Diagnosis Code(s):    --- Professional ---                       K62.1, Rectal polyp                       D12.3, Benign  neoplasm of transverse colon (hepatic                        flexure or splenic flexure)                       K64.0, First degree hemorrhoids                       K92.1, Melena (includes Hematochezia) CPT copyright 2016 American Medical Association. All rights reserved. The codes documented in this report are preliminary and upon coder review may  be revised to meet current compliance requirements. Jonathon Bellows, MD Jonathon Bellows MD, MD 06/30/2017 11:21:12 AM This report has been signed electronically. Number of Addenda: 0 Note Initiated On: 06/30/2017 10:45 AM Scope Withdrawal Time: 0 hours 11 minutes 29 seconds  Total Procedure Duration: 0 hours 13 minutes 40 seconds       [facility]

## 2017-06-30 NOTE — Anesthesia Preprocedure Evaluation (Signed)
Anesthesia Evaluation  Patient identified by MRN, date of birth, ID band Patient awake    Reviewed: Allergy & Precautions, NPO status , Patient's Chart, lab work & pertinent test results  History of Anesthesia Complications Negative for: history of anesthetic complications  Airway Mallampati: II       Dental  (+) Chipped   Pulmonary neg sleep apnea, neg COPD, Current Smoker,           Cardiovascular (-) hypertension(-) Past MI and (-) CHF (-) dysrhythmias      Neuro/Psych neg Seizures    GI/Hepatic Neg liver ROS, GERD (pt denies)  ,  Endo/Other  neg diabetes  Renal/GU negative Renal ROS     Musculoskeletal   Abdominal   Peds  Hematology   Anesthesia Other Findings   Reproductive/Obstetrics                             Anesthesia Physical Anesthesia Plan  ASA: II  Anesthesia Plan: General   Post-op Pain Management:    Induction: Intravenous  PONV Risk Score and Plan: Propofol infusion  Airway Management Planned: Nasal Cannula  Additional Equipment:   Intra-op Plan:   Post-operative Plan:   Informed Consent: I have reviewed the patients History and Physical, chart, labs and discussed the procedure including the risks, benefits and alternatives for the proposed anesthesia with the patient or authorized representative who has indicated his/her understanding and acceptance.     Plan Discussed with:   Anesthesia Plan Comments:         Anesthesia Quick Evaluation

## 2017-07-01 ENCOUNTER — Encounter: Payer: Self-pay | Admitting: Gastroenterology

## 2017-07-01 LAB — SURGICAL PATHOLOGY

## 2017-07-08 ENCOUNTER — Encounter: Payer: Self-pay | Admitting: Gastroenterology

## 2019-12-16 ENCOUNTER — Encounter: Payer: Self-pay | Admitting: Emergency Medicine

## 2019-12-16 ENCOUNTER — Emergency Department
Admission: EM | Admit: 2019-12-16 | Discharge: 2019-12-16 | Disposition: A | Discharge status: Home / Self Care | Payer: Self-pay | Attending: Emergency Medicine | Admitting: Emergency Medicine

## 2019-12-16 ENCOUNTER — Other Ambulatory Visit: Payer: Self-pay

## 2019-12-16 DIAGNOSIS — X58XXXA Exposure to other specified factors, initial encounter: Secondary | ICD-10-CM | POA: Insufficient documentation

## 2019-12-16 DIAGNOSIS — S025XXA Fracture of tooth (traumatic), initial encounter for closed fracture: Secondary | ICD-10-CM | POA: Insufficient documentation

## 2019-12-16 DIAGNOSIS — Y939 Activity, unspecified: Secondary | ICD-10-CM | POA: Insufficient documentation

## 2019-12-16 DIAGNOSIS — Y929 Unspecified place or not applicable: Secondary | ICD-10-CM | POA: Insufficient documentation

## 2019-12-16 DIAGNOSIS — K0889 Other specified disorders of teeth and supporting structures: Secondary | ICD-10-CM

## 2019-12-16 DIAGNOSIS — Y999 Unspecified external cause status: Secondary | ICD-10-CM | POA: Insufficient documentation

## 2019-12-16 DIAGNOSIS — K0499 Other diseases of pulp and periapical tissues: Secondary | ICD-10-CM | POA: Insufficient documentation

## 2019-12-16 DIAGNOSIS — F17210 Nicotine dependence, cigarettes, uncomplicated: Secondary | ICD-10-CM | POA: Insufficient documentation

## 2019-12-16 MED ORDER — AMOXICILLIN 500 MG PO CAPS: 500.0000 mg | ORAL_CAPSULE | Freq: Three times a day (TID) | ORAL | 0 refills | Status: DC

## 2019-12-16 MED ORDER — TRAMADOL HCL 50 MG PO TABS: 50.0000 mg | ORAL_TABLET | Freq: Four times a day (QID) | ORAL | 0 refills | Status: DC | PRN

## 2019-12-16 MED ORDER — LIDOCAINE VISCOUS HCL 2 % MT SOLN: 5.0000 mL | Freq: Four times a day (QID) | OROMUCOSAL | 0 refills | Status: DC | PRN

## 2019-12-16 MED ORDER — IBUPROFEN 600 MG PO TABS: 600.0000 mg | ORAL_TABLET | Freq: Three times a day (TID) | ORAL | 0 refills | Status: DC | PRN

## 2019-12-16 NOTE — Discharge Instructions (Signed)
Follow discharge care instruction take medication as directed.  Reschedule your dental appointment with the [place] dental clinic.

## 2019-12-16 NOTE — ED Triage Notes (Signed)
Pt reports toothache to right lower jaw sine Monday and states that it has a fever in it. Pt not febrile in triage

## 2019-12-16 NOTE — ED Provider Notes (Signed)
[facility] Emergency Department Provider Note   ____________________________________________   First MD Initiated Contact with Patient 12/16/19 1051     (approximate)  I have reviewed the triage vital signs and the nursing notes.   HISTORY  Chief Complaint Dental Pain    HPI Matthew Nolan is a 54 y.o. male patient presents with dental pain secondary to multiple devitalized and fractured teeth.  Patient was scheduled to see [place] [facility] yesterday but had to reschedule because of transportation problems.  Patient denies fever associated with complaint.         Past Medical History:  Diagnosis Date  . Alcohol abuse   . Tobacco abuse     Patient Active Problem List   Diagnosis Date Noted  . GI bleed 06/10/2017  . Lower GI bleed 06/10/2017    Past Surgical History:  Procedure Laterality Date  . COLONOSCOPY WITH PROPOFOL N/A 06/30/2017   Procedure: COLONOSCOPY WITH PROPOFOL;  Surgeon: Jonathon Bellows, MD;  Location: [facility] ENDOSCOPY;  Service: Gastroenterology;  Laterality: N/A;  . ESOPHAGOGASTRODUODENOSCOPY (EGD) WITH PROPOFOL N/A 06/30/2017   Procedure: ESOPHAGOGASTRODUODENOSCOPY (EGD) WITH PROPOFOL;  Surgeon: Jonathon Bellows, MD;  Location: [facility] ENDOSCOPY;  Service: Gastroenterology;  Laterality: N/A;  . NO PAST SURGERIES      Prior to Admission medications   Medication Sig Start Date End Date Taking? Authorizing Provider  acetaminophen (TYLENOL) 325 MG tablet Take 2 tablets (650 mg total) by mouth every 6 (six) hours as needed for mild pain (or Fever >/= 101). 06/12/17   Nicholes Mango, MD  amoxicillin (AMOXIL) 500 MG capsule Take 1 capsule (500 mg total) by mouth 3 (three) times daily. 12/16/19   Sable Feil, PA-C  docusate sodium (COLACE) 100 MG capsule Take 1 capsule (100 mg total) by mouth daily as needed for mild constipation. 06/12/17   Nicholes Mango, MD  hydrocortisone (ANUSOL-HC) 25 MG suppository Place 1 suppository (25 mg total)  rectally every 12 (twelve) hours. 06/25/17   Jonathon Bellows, MD  ibuprofen (ADVIL) 600 MG tablet Take 1 tablet (600 mg total) by mouth every 8 (eight) hours as needed. 12/16/19   Sable Feil, PA-C  lidocaine (XYLOCAINE) 2 % solution Use as directed 5 mLs in the mouth or throat every 6 (six) hours as needed for mouth pain. Oral swish for dental pain 12/16/19   Sable Feil, PA-C  Multiple Vitamin (MULTIVITAMIN WITH MINERALS) TABS tablet Take 1 tablet by mouth daily. Patient not taking: Reported on 06/25/2017 06/13/17   Nicholes Mango, MD  pantoprazole (PROTONIX) 40 MG tablet Take 1 tablet (40 mg total) by mouth 2 (two) times daily before a meal. 06/12/17 06/12/18  Gouru, Illene Silver, MD  traMADol (ULTRAM) 50 MG tablet Take 1 tablet (50 mg total) by mouth every 6 (six) hours as needed. 12/16/19 12/15/20  Sable Feil, PA-C    Allergies Patient has no known allergies.  Family History  Family history unknown: Yes    Social History Social History   Tobacco Use  . Smoking status: Current Every Day Smoker  . Smokeless tobacco: Never Used  Substance Use Topics  . Alcohol use: Yes    Alcohol/week: 42.0 standard drinks    Types: 42 Cans of beer per week  . Drug use: No    Review of Systems Constitutional: No fever/chills Eyes: No visual changes. ENT: No sore throat.  Dental pain Cardiovascular: Denies chest pain. Respiratory: Denies shortness of breath. Gastrointestinal: No abdominal pain.  No nausea, no vomiting.  No diarrhea.  No constipation. Genitourinary: Negative for dysuria. Musculoskeletal: Negative for back pain. Skin: Negative for rash. Neurological: Negative for headaches, focal weakness or numbness.   ____________________________________________   PHYSICAL EXAM:  VITAL SIGNS: ED Triage Vitals  Enc Vitals Group     BP 12/16/19 1037 (!) 134/93     Pulse Rate 12/16/19 1037 (!) 117     Resp 12/16/19 1037 20     Temp 12/16/19 1036 98.4 F (36.9 C)     Temp Source  12/16/19 1036 Oral     SpO2 12/16/19 1037 98 %     Weight 12/16/19 1036 160 lb (72.6 kg)     Height 12/16/19 1036 5\' 8"  (1.727 m)     Head Circumference --      Peak Flow --      Pain Score 12/16/19 1036 0     Pain Loc --      Pain Edu? --      Excl. in [place]? --    Constitutional: Alert and oriented. Well appearing and in no acute distress. Mouth/Throat: Mucous membranes are moist.  Oropharynx non-erythematous.  Multiple fraction devitalized upper and lower teeth.  Mild gingival edema.  No obvious abscess. Hematological/Lymphatic/Immunilogical: No cervical lymphadenopathy. Cardiovascular: Normal rate, regular rhythm. Grossly normal heart sounds.  Good peripheral circulation. Respiratory: Normal respiratory effort.  No retractions. Lungs CTAB.  ____________________________________________   LABS (all labs ordered are listed, but only abnormal results are displayed)  Labs Reviewed - No data to display ____________________________________________  EKG   ____________________________________________  RADIOLOGY  ED MD interpretation:    Official radiology report(s): No results found.  ____________________________________________   PROCEDURES  Procedure(s) performed (including Critical Care):  Procedures   ____________________________________________   INITIAL IMPRESSION / ASSESSMENT AND PLAN / ED COURSE  As part of my medical decision making, I reviewed the following data within the [place]   Patient presents with dental pain secondary to multiple caries and devitalized teeth.  Patient given discharge care instruction advised to reschedule dental appointment.  Take medication as directed.       [name] was evaluated in Emergency Department on 12/16/2019 for the symptoms described in the history of present illness. He was evaluated in the context of the global COVID-19 pandemic, which necessitated consideration that the patient might be at  risk for infection with the SARS-CoV-2 virus that causes COVID-19. Institutional protocols and algorithms that pertain to the evaluation of patients at risk for COVID-19 are in a state of rapid change based on information released by regulatory bodies including the CDC and federal and state organizations. These policies and algorithms were followed during the patient's care in the ED.       ____________________________________________   FINAL CLINICAL IMPRESSION(S) / ED DIAGNOSES  Final diagnoses:  Tooth pain  Closed fracture of tooth, initial encounter     ED Discharge Orders         Ordered    amoxicillin (AMOXIL) 500 MG capsule  3 times daily     12/16/19 1058    ibuprofen (ADVIL) 600 MG tablet  Every 8 hours PRN     12/16/19 1058    traMADol (ULTRAM) 50 MG tablet  Every 6 hours PRN     12/16/19 1058    lidocaine (XYLOCAINE) 2 % solution  Every 6 hours PRN     12/16/19 1058           Note:  This document was prepared using Dragon voice  recognition software and may include unintentional dictation errors.    Sable Feil, PA-C 12/16/19 1103    Lavonia Drafts, MD 12/16/19 1225

## 2020-10-31 ENCOUNTER — Other Ambulatory Visit: Payer: Self-pay

## 2020-10-31 ENCOUNTER — Emergency Department: Payer: Medicaid Other

## 2020-10-31 ENCOUNTER — Emergency Department
Admission: EM | Admit: 2020-10-31 | Discharge: 2020-10-31 | Disposition: A | Discharge status: Home / Self Care | Payer: Medicaid Other | Attending: Emergency Medicine | Admitting: Emergency Medicine

## 2020-10-31 DIAGNOSIS — M5412 Radiculopathy, cervical region: Secondary | ICD-10-CM

## 2020-10-31 DIAGNOSIS — F17200 Nicotine dependence, unspecified, uncomplicated: Secondary | ICD-10-CM | POA: Insufficient documentation

## 2020-10-31 DIAGNOSIS — Z79899 Other long term (current) drug therapy: Secondary | ICD-10-CM | POA: Insufficient documentation

## 2020-10-31 DIAGNOSIS — R42 Dizziness and giddiness: Secondary | ICD-10-CM

## 2020-10-31 DIAGNOSIS — I10 Essential (primary) hypertension: Secondary | ICD-10-CM | POA: Insufficient documentation

## 2020-10-31 LAB — CBC
HCT: 44.5 % (ref 39.0–52.0)
Hemoglobin: 15.2 g/dL (ref 13.0–17.0)
MCH: 32.5 pg (ref 26.0–34.0)
MCHC: 34.2 g/dL (ref 30.0–36.0)
MCV: 95.1 fL (ref 80.0–100.0)
Platelets: 278 10*3/uL (ref 150–400)
RBC: 4.68 MIL/uL (ref 4.22–5.81)
RDW: 11.5 % (ref 11.5–15.5)
WBC: 8.2 10*3/uL (ref 4.0–10.5)
nRBC: 0 % (ref 0.0–0.2)

## 2020-10-31 LAB — BASIC METABOLIC PANEL
Anion gap: 7 (ref 5–15)
BUN: 13 mg/dL (ref 6–20)
CO2: 26 mmol/L (ref 22–32)
Calcium: 9.5 mg/dL (ref 8.9–10.3)
Chloride: 102 mmol/L (ref 98–111)
Creatinine, Ser: 1.31 mg/dL — ABNORMAL HIGH (ref 0.61–1.24)
GFR, Estimated: >60 mL/min (ref >60)
Glucose, Bld: 102 mg/dL — ABNORMAL HIGH (ref 70–99)
Potassium: 3.9 mmol/L (ref 3.5–5.1)
Sodium: 135 mmol/L (ref 135–145)

## 2020-10-31 LAB — TROPONIN I (HIGH SENSITIVITY): Troponin I (High Sensitivity): 3 ng/L (ref <18)

## 2020-10-31 MED ORDER — MECLIZINE HCL 12.5 MG PO TABS: 12.5000 mg | ORAL_TABLET | Freq: Two times a day (BID) | ORAL | 0 refills | Status: AC | PRN

## 2020-10-31 MED ORDER — AMLODIPINE BESYLATE 5 MG PO TABS: 5.0000 mg | ORAL_TABLET | Freq: Every day | ORAL | 0 refills | Status: AC

## 2020-10-31 NOTE — ED Provider Notes (Signed)
[facility] Emergency Department Provider Note  ____________________________________________   Event Date/Time   First MD Initiated Contact with Patient 10/31/20 1821     (approximate)  I have reviewed the triage vital signs and the nursing notes.   HISTORY  Chief Complaint Weakness   HPI Matthew Nolan is a 55 y.o. male with a past medical history of GI bleed secondary to colonoscopy in 2018 and previous significant alcohol abuse who presents for assessment approximately 3 months of intermittent episodes of vertigo associated with difficulty with his speech and pain in his right shoulder rating down his right arm.  He states that he often feels dizzy and room spinning in the mornings but that will happen randomly throughout the day.  He states he is not currently feeling dizzy or any vertigo or any pain in his right arm.  He denies any other acute sick symptoms including headache, vision changes, chest pain, cough, shortness of breath, nausea, vomiting, diarrhea, dysuria, rash or other extremity pain or any extremity weakness.  States he is not currently on any medications.  Does endorse tobacco abuse.  States that after his GI bleed from his colonoscopy couple of years ago he has significantly cut back on his drinking and only drinks 1-2 beverages on average per day.  Denies any illicit drug use.  Has been no change in his symptoms the last 3 months but he states he recently got some insurance want to get checked out.  No recent falls or injuries.         Past Medical History:  Diagnosis Date  . Alcohol abuse   . Tobacco abuse     Patient Active Problem List   Diagnosis Date Noted  . GI bleed 06/10/2017  . Lower GI bleed 06/10/2017    Past Surgical History:  Procedure Laterality Date  . COLONOSCOPY WITH PROPOFOL N/A 06/30/2017   Procedure: COLONOSCOPY WITH PROPOFOL;  Surgeon: Jonathon Bellows, MD;  Location[facility] ENDOSCOPY;  Service: Gastroenterology;   Laterality: N/A;  . ESOPHAGOGASTRODUODENOSCOPY (EGD) WITH PROPOFOL N/A 06/30/2017   Procedure: ESOPHAGOGASTRODUODENOSCOPY (EGD) WITH PROPOFOL;  Surgeon: Jonathon Bellows, MD;  Location: [facility] ENDOSCOPY;  Service: Gastroenterology;  Laterality: N/A;  . NO PAST SURGERIES      Prior to Admission medications   Medication Sig Start Date End Date Taking? Authorizing Provider  amLODipine (NORVASC) 5 MG tablet Take 1 tablet (5 mg total) by mouth daily. 10/31/20 11/30/20 Yes Lucrezia Starch, MD  meclizine (ANTIVERT) 12.5 MG tablet Take 1 tablet (12.5 mg total) by mouth 2 (two) times daily as needed for up to 5 days for dizziness. 10/31/20 11/05/20 Yes Lucrezia Starch, MD  acetaminophen (TYLENOL) 325 MG tablet Take 2 tablets (650 mg total) by mouth every 6 (six) hours as needed for mild pain (or Fever >/= 101). 06/12/17   Nicholes Mango, MD  docusate sodium (COLACE) 100 MG capsule Take 1 capsule (100 mg total) by mouth daily as needed for mild constipation. 06/12/17   Nicholes Mango, MD  ibuprofen (ADVIL) 600 MG tablet Take 1 tablet (600 mg total) by mouth every 8 (eight) hours as needed. 12/16/19   Sable Feil, PA-C  Multiple Vitamin (MULTIVITAMIN WITH MINERALS) TABS tablet Take 1 tablet by mouth daily. Patient not taking: Reported on 06/25/2017 06/13/17   Nicholes Mango, MD  pantoprazole (PROTONIX) 40 MG tablet Take 1 tablet (40 mg total) by mouth 2 (two) times daily before a meal. 06/12/17 06/12/18  Nicholes Mango, MD  Allergies Patient has no known allergies.  Family History  Family history unknown: Yes    Social History Social History   Tobacco Use  . Smoking status: Current Every Day Smoker  . Smokeless tobacco: Never Used  Vaping Use  . Vaping Use: Never used  Substance Use Topics  . Alcohol use: Yes    Alcohol/week: 42.0 standard drinks    Types: 42 Cans of beer per week  . Drug use: Not Currently    Types: Marijuana    Review of Systems  Review of Systems  Constitutional: Negative for  chills and fever.  HENT: Negative for sore throat.   Eyes: Negative for pain.  Respiratory: Negative for cough and stridor.   Cardiovascular: Negative for chest pain.  Gastrointestinal: Negative for vomiting.  Genitourinary: Negative for dysuria.  Musculoskeletal: Negative for myalgias.  Skin: Negative for rash.  Neurological: Positive for dizziness and sensory change ( tingling and pains in R arm). Negative for seizures, loss of consciousness and headaches.  Psychiatric/Behavioral: Negative for suicidal ideas.  All other systems reviewed and are negative.     ____________________________________________   PHYSICAL EXAM:  VITAL SIGNS: ED Triage Vitals  Enc Vitals Group     BP 10/31/20 1648 (!) 151/104     Pulse Rate 10/31/20 1648 90     Resp 10/31/20 1648 18     Temp 10/31/20 1648 98.4 F (36.9 C)     Temp src --      SpO2 10/31/20 1648 100 %     Weight --      Height --      Head Circumference --      Peak Flow --      Pain Score 10/31/20 1645 0     Pain Loc --      Pain Edu? --      Excl. in [place]? --    Vitals:   10/31/20 1648 10/31/20 1900  BP: (!) 151/104 (!) 142/92  Pulse: 90 87  Resp: 18   Temp: 98.4 F (36.9 C)   SpO2: 100% 94%   Physical Exam Vitals and nursing note reviewed.  Constitutional:      Appearance: He is well-developed and well-nourished.  HENT:     Head: Normocephalic and atraumatic.     Right Ear: External ear normal.     Left Ear: External ear normal.     Nose: Nose normal.     Mouth/Throat:     Mouth: Mucous membranes are moist.  Eyes:     Conjunctiva/sclera: Conjunctivae normal.  Cardiovascular:     Rate and Rhythm: Normal rate and regular rhythm.     Heart sounds: No murmur heard.   Pulmonary:     Effort: Pulmonary effort is normal. No respiratory distress.     Breath sounds: Normal breath sounds.  Abdominal:     Palpations: Abdomen is soft.     Tenderness: There is no abdominal tenderness.  Musculoskeletal:         General: No edema.     Cervical back: Neck supple.  Skin:    General: Skin is warm and dry.     Capillary Refill: Capillary refill takes less than 2 seconds.  Neurological:     Mental Status: He is alert and oriented to person, place, and time.  Psychiatric:        Mood and Affect: Mood and affect and mood normal.     Cranial nerves II through XII grossly intact.  No pronator drift.  No finger dysmetria.  Symmetric 5/5 strength of all extremities.  Sensation intact to light touch in all extremities.  Unremarkable unassisted gait.  ____________________________________________   LABS (all labs ordered are listed, but only abnormal results are displayed)  Labs Reviewed  BASIC METABOLIC PANEL - Abnormal; Notable for the following components:      Result Value   Glucose, Bld 102 (*)    Creatinine, Ser 1.31 (*)    All other components within normal limits  CBC  TROPONIN I (HIGH SENSITIVITY)  TROPONIN I (HIGH SENSITIVITY)   ____________________________________________  EKG  Sinus rhythm with ventricular of 88, normal axis, unremarkable intervals and no evidence of acute ischemia or other significant underlying arrhythmia.  ____________________________________________  RADIOLOGY  ED MD interpretation: CT head, C-spine and chest show no evidence of intracranial hemorrhage, skull fracture, C-spine injury or pneumothorax or other acute process in the chest.  CT chest was obtained as chest x-ray initially was concerning for possible left apical pneumothorax.  However there is no evidence of this on CT.  No evidence of pneumonia, volume overload or any other clear acute intrathoracic process.  Official radiology report(s): DG Chest 2 View  Result Date: 10/31/2020 CLINICAL DATA:  Chest pain, slurred speech. EXAM: CHEST - 2 VIEW COMPARISON:  Chest x-ray dated 08/06/2011 FINDINGS: Heart size and mediastinal contours are within normal limits. Lungs are clear. No pleural effusion. Questionable  tiny pneumothorax at the LEFT lung apex. Probable emphysematous blebs at the RIGHT lung apex. Osseous structures about the chest are unremarkable. IMPRESSION: 1. Questionable tiny pneumothorax at the LEFT lung apex. At minimum, recommend follow-up chest x-ray to ensure stability/resolution. Could consider chest CT for confirmation. 2. Lungs are clear. Electronically Signed   By: Franki Cabot M.D.   On: 10/31/2020 17:15   CT Head Wo Contrast  Result Date: 10/31/2020 CLINICAL DATA:  Slurred speech mobility issues EXAM: CT HEAD WITHOUT CONTRAST TECHNIQUE: Contiguous axial images were obtained from the base of the skull through the vertex without intravenous contrast. COMPARISON:  None. FINDINGS: Brain: No evidence of acute infarction, hemorrhage, hydrocephalus, extra-axial collection or mass lesion/mass effect. Vascular: No hyperdense vessel or unexpected calcification. Skull: Normal. Negative for fracture or focal lesion. Sinuses/Orbits: No acute finding. Other: None IMPRESSION: Negative non contrasted CT appearance of the brain. Electronically Signed   By: Donavan Foil M.D.   On: 10/31/2020 17:04   CT Chest Wo Contrast  Result Date: 10/31/2020 CLINICAL DATA:  Cervical radiculopathy. Slurred speech, mobility issue in sharp pains in shoulder. Possible pneumothorax. EXAM: CT CHEST WITHOUT CONTRAST TECHNIQUE: Multidetector CT imaging of the cervical spine was performed without intravenous contrast. Multiplanar CT image reconstructions were also generated. Multidetector CT imaging of the chest was performed following the standard protocol without IV contrast. COMPARISON:  Cxr 10/31/20. FINDINGS: CT C SPINE: Alignment: Normal. Skull base and vertebrae: No mild multilevel degenerative changes of the spine that is the worse at the C5-C6 level. No acute fracture. No aggressive appearing focal osseous lesion or focal pathologic process. Soft tissues and spinal canal: No prevertebral fluid or swelling. No visible canal  hematoma. Other: None. CT CHEST: Cardiovascular: Normal heart size. No significant pericardial effusion. The thoracic aorta is normal in caliber. No atherosclerotic plaque of the thoracic aorta. Mild coronary artery calcifications. Mediastinum/Nodes: No enlarged mediastinal or axillary lymph nodes. No gross hilar adenopathy, noting limited sensitivity for the detection of hilar adenopathy on this noncontrast study. Thyroid gland, trachea, and esophagus demonstrate no significant findings. Lungs/Pleura: A moderate paraseptal  emphysematous changes of the apices. Mild bilateral upper lobe centrilobular emphysematous changes. No focal consolidation. There is a 4 mm nodule within the right vertebral. COVID. No pleural mass. No pleural effusion. No pneumothorax. Upper Abdomen: No acute abnormality. Musculoskeletal: No chest wall mass or suspicious bone lesions identified. IMPRESSION: 1. No acute displaced fracture or traumatic listhesis of the cervical spine. 2. No pneumothorax.  No acute intrathoracic abnormality. 3.  Emphysema (ICD10-J43.9). Electronically Signed   By: Iven Finn M.D.   On: 10/31/2020 19:36   CT Cervical Spine Wo Contrast  Result Date: 10/31/2020 CLINICAL DATA:  Cervical radiculopathy. Slurred speech, mobility issue in sharp pains in shoulder. Possible pneumothorax. EXAM: CT CHEST WITHOUT CONTRAST TECHNIQUE: Multidetector CT imaging of the cervical spine was performed without intravenous contrast. Multiplanar CT image reconstructions were also generated. Multidetector CT imaging of the chest was performed following the standard protocol without IV contrast. COMPARISON:  Cxr 10/31/20. FINDINGS: CT C SPINE: Alignment: Normal. Skull base and vertebrae: No mild multilevel degenerative changes of the spine that is the worse at the C5-C6 level. No acute fracture. No aggressive appearing focal osseous lesion or focal pathologic process. Soft tissues and spinal canal: No prevertebral fluid or swelling. No  visible canal hematoma. Other: None. CT CHEST: Cardiovascular: Normal heart size. No significant pericardial effusion. The thoracic aorta is normal in caliber. No atherosclerotic plaque of the thoracic aorta. Mild coronary artery calcifications. Mediastinum/Nodes: No enlarged mediastinal or axillary lymph nodes. No gross hilar adenopathy, noting limited sensitivity for the detection of hilar adenopathy on this noncontrast study. Thyroid gland, trachea, and esophagus demonstrate no significant findings. Lungs/Pleura: A moderate paraseptal emphysematous changes of the apices. Mild bilateral upper lobe centrilobular emphysematous changes. No focal consolidation. There is a 4 mm nodule within the right vertebral. COVID. No pleural mass. No pleural effusion. No pneumothorax. Upper Abdomen: No acute abnormality. Musculoskeletal: No chest wall mass or suspicious bone lesions identified. IMPRESSION: 1. No acute displaced fracture or traumatic listhesis of the cervical spine. 2. No pneumothorax.  No acute intrathoracic abnormality. 3.  Emphysema (ICD10-J43.9). Electronically Signed   By: Iven Finn M.D.   On: 10/31/2020 19:36    ____________________________________________   PROCEDURES  Procedure(s) performed (including Critical Care):  .1-3 Lead EKG Interpretation Performed by: Lucrezia Starch, MD Authorized by: Lucrezia Starch, MD     Interpretation: normal     ECG rate assessment: normal     Rhythm: sinus rhythm     Ectopy: none     Conduction: normal       ____________________________________________   INITIAL IMPRESSION / ASSESSMENT AND PLAN / ED COURSE      Patient presents with above-stated history exam for assessment of approximately 3 months of waxing and waning vertigo as well as some pain rating down his neck into his right arm.  He is not currently experiencing vertigo in the emergency room today states his pain is actually much better today than it often is.  No clear  alleviating aggravating factors although thinks his vertigo is sometimes worse in the morning.  Denies any other acute concerns at this time.  No acute change in his symptoms the last couple days.  On arrival he is hypertensive with a BP of 151/104 with otherwise stable vital signs on room air.  He does have a nonfocal neuro exam and is neurovascular intact in all extremities without any evidence of trauma or acute infectious process in the right upper extremity neck head face or scalp.  No history or exam findings to suggest recent acute traumatic injury.  Patient does not appear significantly dehydrated and while chest x-ray obtained due to report of dizziness in triage showed possible left apical pneumothorax CT obtained shows no evidence of this.  While there is no evidence of injury on C-spine symptoms described are consistent with possible cervical radiculopathy.  No evidence of acute neurovascular deficit in the right upper extremity or acute infectious process.  CT head shows no evidence of CVA and given the waxing and waning symptoms I am more suspicion for likely peripheral etiology for his vertigo at this time.  ECG and troponin are not suggestive of ACS or arrhythmia.  BMP shows no significant electrolyte or metabolic derangements.  CBC shows no leukocytosis or acute anemia.   Discussed with patient that his blood pressure was elevated.  Will start patient on low-dose amlodipine.  We will also prescribe short course of Antivert to see if this helps with his vertigo.  Strongly advised patient to follow-up with PCP in the next couple of days.  Counseled on importance of tobacco cessation.  Given stable vitals with otherwise reassuring exam and work-up and duration of symptoms I believe he is safe for discharge with plan for outpatient follow-up.  Discharged stable condition.  Strict return precautions advised and discussed.       ____________________________________________   FINAL CLINICAL  IMPRESSION(S) / ED DIAGNOSES  Final diagnoses:  Vertigo  Hypertension, unspecified type  Cervical radiculopathy    Medications - No data to display   ED Discharge Orders         Ordered    amLODipine (NORVASC) 5 MG tablet  Daily        10/31/20 1846    meclizine (ANTIVERT) 12.5 MG tablet  2 times daily PRN        10/31/20 1846           Note:  This document was prepared using Dragon voice recognition software and may include unintentional dictation errors.   Lucrezia Starch, MD 10/31/20 [phone]

## 2020-10-31 NOTE — ED Triage Notes (Addendum)
Pt comes with c/o slurred speech, mobility issues and sharp pains in shoulder down to elbow. Pt states this has been going on for months. Pt states blurry vision.  Pt states he isn't sure if it is his BP or diabetes. Pt states no hx of either. Pt denies any medication.s  Pt states earlier this am he had some slurred speech again and then it resolved  Pt denies any current symptoms.Marland Kitchen

## 2021-01-31 ENCOUNTER — Emergency Department: Payer: 59

## 2021-01-31 ENCOUNTER — Other Ambulatory Visit: Payer: Self-pay

## 2021-01-31 ENCOUNTER — Emergency Department
Admission: EM | Admit: 2021-01-31 | Discharge: 2021-01-31 | Disposition: A | Discharge status: Home / Self Care | Payer: 59 | Attending: Emergency Medicine | Admitting: Emergency Medicine

## 2021-01-31 DIAGNOSIS — R42 Dizziness and giddiness: Secondary | ICD-10-CM | POA: Diagnosis present

## 2021-01-31 DIAGNOSIS — R519 Headache, unspecified: Secondary | ICD-10-CM | POA: Insufficient documentation

## 2021-01-31 DIAGNOSIS — R4781 Slurred speech: Secondary | ICD-10-CM | POA: Diagnosis not present

## 2021-01-31 DIAGNOSIS — F17200 Nicotine dependence, unspecified, uncomplicated: Secondary | ICD-10-CM | POA: Diagnosis not present

## 2021-01-31 DIAGNOSIS — Z79899 Other long term (current) drug therapy: Secondary | ICD-10-CM | POA: Diagnosis not present

## 2021-01-31 HISTORY — DX: Dizziness and giddiness: R42

## 2021-01-31 LAB — URINALYSIS, COMPLETE (UACMP) WITH MICROSCOPIC
Bacteria, UA: NONE SEEN
Bilirubin Urine: NEGATIVE
Glucose, UA: NEGATIVE mg/dL
Ketones, ur: NEGATIVE mg/dL
Leukocytes,Ua: NEGATIVE
Nitrite: NEGATIVE
Protein, ur: NEGATIVE mg/dL
Specific Gravity, Urine: 1.001 — ABNORMAL LOW (ref 1.005–1.030)
Squamous Epithelial / HPF: NONE SEEN (ref 0–5)
WBC, UA: NONE SEEN WBC/hpf (ref 0–5)
pH: 5 (ref 5.0–8.0)

## 2021-01-31 LAB — CBC WITH DIFFERENTIAL/PLATELET
Abs Immature Granulocytes: 0.03 10*3/uL (ref 0.00–0.07)
Basophils Absolute: 0.1 10*3/uL (ref 0.0–0.1)
Basophils Relative: 2 %
Eosinophils Absolute: 0.1 10*3/uL (ref 0.0–0.5)
Eosinophils Relative: 1 %
HCT: 43.3 % (ref 39.0–52.0)
Hemoglobin: 14.8 g/dL (ref 13.0–17.0)
Immature Granulocytes: 1 %
Lymphocytes Relative: 37 %
Lymphs Abs: 2.3 10*3/uL (ref 0.7–4.0)
MCH: 32.4 pg (ref 26.0–34.0)
MCHC: 34.2 g/dL (ref 30.0–36.0)
MCV: 94.7 fL (ref 80.0–100.0)
Monocytes Absolute: 0.5 10*3/uL (ref 0.1–1.0)
Monocytes Relative: 7 %
Neutro Abs: 3.3 10*3/uL (ref 1.7–7.7)
Neutrophils Relative %: 52 %
Platelets: 272 10*3/uL (ref 150–400)
RBC: 4.57 MIL/uL (ref 4.22–5.81)
RDW: 11.8 % (ref 11.5–15.5)
WBC: 6.3 10*3/uL (ref 4.0–10.5)
nRBC: 0 % (ref 0.0–0.2)

## 2021-01-31 LAB — COMPREHENSIVE METABOLIC PANEL
ALT: 15 U/L (ref 0–44)
AST: 18 U/L (ref 15–41)
Albumin: 4 g/dL (ref 3.5–5.0)
Alkaline Phosphatase: 84 U/L (ref 38–126)
Anion gap: 9 (ref 5–15)
BUN: 9 mg/dL (ref 6–20)
CO2: 23 mmol/L (ref 22–32)
Calcium: 9.3 mg/dL (ref 8.9–10.3)
Chloride: 104 mmol/L (ref 98–111)
Creatinine, Ser: 0.91 mg/dL (ref 0.61–1.24)
GFR, Estimated: >60 mL/min (ref >60)
Glucose, Bld: 98 mg/dL (ref 70–99)
Potassium: 4.1 mmol/L (ref 3.5–5.1)
Sodium: 136 mmol/L (ref 135–145)
Total Bilirubin: 0.6 mg/dL (ref 0.3–1.2)
Total Protein: 7.5 g/dL (ref 6.5–8.1)

## 2021-01-31 LAB — TROPONIN I (HIGH SENSITIVITY): Troponin I (High Sensitivity): 3 ng/L (ref <18)

## 2021-01-31 MED ORDER — MECLIZINE HCL 50 MG PO TABS: ORAL_TABLET | ORAL | 0 refills | Status: AC

## 2021-01-31 MED ORDER — PREDNISONE 10 MG PO TABS: 30.0000 mg | ORAL_TABLET | Freq: Every day | ORAL | 0 refills | Status: DC

## 2021-01-31 MED ORDER — MECLIZINE HCL 25 MG PO TABS
25.0000 mg | ORAL_TABLET | Freq: Once | ORAL | Status: AC
  Administered 2021-01-31: 25 mg via ORAL
  Filled 2021-01-31: qty 1

## 2021-01-31 MED ORDER — HYDROCHLOROTHIAZIDE 25 MG PO TABS: 25.0000 mg | ORAL_TABLET | Freq: Every day | ORAL | 3 refills | Status: AC

## 2021-01-31 MED ORDER — GADOBUTROL 1 MMOL/ML IV SOLN
7.0000 mL | Freq: Once | INTRAVENOUS | Status: AC | PRN
  Administered 2021-01-31: 7 mL via INTRAVENOUS
  Filled 2021-01-31: qty 7.5

## 2021-01-31 MED ORDER — SODIUM CHLORIDE 0.9 % IV BOLUS
1000.0000 mL | Freq: Once | INTRAVENOUS | Status: AC
  Administered 2021-01-31: 1000 mL via INTRAVENOUS

## 2021-01-31 NOTE — ED Provider Notes (Signed)
[facility] Emergency Department Provider Note  ____________________________________________   Event Date/Time   First MD Initiated Contact with Patient 01/31/21 1206     (approximate)  I have reviewed the triage vital signs and the nursing notes.   HISTORY  Chief Complaint Dizziness    HPI Matthew Nolan is a 55 y.o. male presents emergency department complaining of dizziness and intermittent slurred speech.  Patient states symptoms have been ongoing for the past 3 months.  He was given a prescription for medication that did not help.  States he is also been out of his blood pressure medication.  However he did stop drinking alcohol and has noticed his blood pressure has remained normal.  He states some headaches in the mornings.  No fever or chills.  No chest pain or shortness of breath.    Past Medical History:  Diagnosis Date  . Alcohol abuse   . Tobacco abuse   . Vertigo     Patient Active Problem List   Diagnosis Date Noted  . GI bleed 06/10/2017  . Lower GI bleed 06/10/2017    Past Surgical History:  Procedure Laterality Date  . COLONOSCOPY WITH PROPOFOL N/A 06/30/2017   Procedure: COLONOSCOPY WITH PROPOFOL;  Surgeon: Jonathon Bellows, MD;  Location: [facility] ENDOSCOPY;  Service: Gastroenterology;  Laterality: N/A;  . ESOPHAGOGASTRODUODENOSCOPY (EGD) WITH PROPOFOL N/A 06/30/2017   Procedure: ESOPHAGOGASTRODUODENOSCOPY (EGD) WITH PROPOFOL;  Surgeon: Jonathon Bellows, MD;  Location: [facility] ENDOSCOPY;  Service: Gastroenterology;  Laterality: N/A;  . NO PAST SURGERIES      Prior to Admission medications   Medication Sig Start Date End Date Taking? Authorizing Provider  hydrochlorothiazide (HYDRODIURIL) 25 MG tablet Take 1 tablet (25 mg total) by mouth daily. 01/31/21  Yes Versie Starks, PA-C  meclizine (ANTIVERT) 50 MG tablet 1/2 pill tid 01/31/21  Yes Gabriela Giannelli, Linden Dolin, PA-C  predniSONE (DELTASONE) 10 MG tablet Take 3 tablets (30 mg total) by mouth daily  with breakfast. 01/31/21  Yes Clarie Camey, Linden Dolin, PA-C  acetaminophen (TYLENOL) 325 MG tablet Take 2 tablets (650 mg total) by mouth every 6 (six) hours as needed for mild pain (or Fever >/= 101). 06/12/17   Gouru, Illene Silver, MD  amLODipine (NORVASC) 5 MG tablet Take 1 tablet (5 mg total) by mouth daily. 10/31/20 11/30/20  Lucrezia Starch, MD  docusate sodium (COLACE) 100 MG capsule Take 1 capsule (100 mg total) by mouth daily as needed for mild constipation. 06/12/17   Nicholes Mango, MD  ibuprofen (ADVIL) 600 MG tablet Take 1 tablet (600 mg total) by mouth every 8 (eight) hours as needed. 12/16/19   Sable Feil, PA-C  Multiple Vitamin (MULTIVITAMIN WITH MINERALS) TABS tablet Take 1 tablet by mouth daily. Patient not taking: Reported on 06/25/2017 06/13/17   Nicholes Mango, MD  pantoprazole (PROTONIX) 40 MG tablet Take 1 tablet (40 mg total) by mouth 2 (two) times daily before a meal. 06/12/17 06/12/18  Nicholes Mango, MD    Allergies Patient has no known allergies.  Family History  Family history unknown: Yes    Social History Social History   Tobacco Use  . Smoking status: Current Every Day Smoker  . Smokeless tobacco: Never Used  Vaping Use  . Vaping Use: Never used  Substance Use Topics  . Alcohol use: Yes    Alcohol/week: 42.0 standard drinks    Types: 42 Cans of beer per week  . Drug use: Not Currently    Types: Marijuana    Review of  Systems  Constitutional: No fever/chills Eyes: No visual changes. ENT: No sore throat. Respiratory: Denies cough Cardiovascular: Denies chest pain Gastrointestinal: Denies abdominal pain Genitourinary: Negative for dysuria. Musculoskeletal: Negative for back pain. Skin: Negative for rash. Neuro: Positive for dizziness and slurred speech Psychiatric: no mood changes,     ____________________________________________   PHYSICAL EXAM:  VITAL SIGNS: ED Triage Vitals  Enc Vitals Group     BP 01/31/21 1140 129/76     Pulse Rate 01/31/21 1140  91     Resp 01/31/21 1140 16     Temp 01/31/21 1140 98.4 F (36.9 C)     Temp Source 01/31/21 1140 Oral     SpO2 01/31/21 1140 98 %     Weight 01/31/21 1141 150 lb (68 kg)     Height 01/31/21 1141 5\' 8"  (1.727 m)     Head Circumference --      Peak Flow --      Pain Score 01/31/21 1141 0     Pain Loc --      Pain Edu? --      Excl. in [place]? --     Constitutional: Alert and oriented. Well appearing and in no acute distress. Eyes: Conjunctivae are normal.  PERRL, EOMI, 12 beats nystagmus bilaterally Head: Atraumatic. Nose: No congestion/rhinnorhea. Mouth/Throat: Mucous membranes are moist.   Neck:  supple no lymphadenopathy noted Cardiovascular: Normal rate, regular rhythm. Heart sounds are normal Respiratory: Normal respiratory effort.  No retractions, lungs c t a  Abd: soft nontender bs normal all 4 quad GU: deferred Musculoskeletal: FROM all extremities, warm and well perfused Neurologic:  Normal speech and language.  Patient sways during Romberg, he is able to correct himself although noticed a large amount of swelling Skin:  Skin is warm, dry and intact. No rash noted. Psychiatric: Mood and affect are normal. Speech and behavior are normal.  ____________________________________________   LABS (all labs ordered are listed, but only abnormal results are displayed)  Labs Reviewed  URINALYSIS, COMPLETE (UACMP) WITH MICROSCOPIC - Abnormal; Notable for the following components:      Result Value   Color, Urine COLORLESS (*)    APPearance CLEAR (*)    Specific Gravity, Urine 1.001 (*)    Hgb urine dipstick SMALL (*)    All other components within normal limits  COMPREHENSIVE METABOLIC PANEL  CBC WITH DIFFERENTIAL/PLATELET  TROPONIN I (HIGH SENSITIVITY)  TROPONIN I (HIGH SENSITIVITY)   ____________________________________________   ____________________________________________  RADIOLOGY  MRI of the brain with and without contrast Chest  x-ray ____________________________________________   PROCEDURES  Procedure(s) performed: No  Procedures    ____________________________________________   INITIAL IMPRESSION / ASSESSMENT AND PLAN / ED COURSE  Pertinent labs & imaging results that were available during my care of the patient were reviewed by me and considered in my medical decision making (see chart for details).   Patient is a 55 year old male presents with persistent dizziness and intermittent slurred speech.  See HPI.  Physical exam shows patient appears stable  DDx: Vertigo, Mnire's, posterior CVA  Labs and imaging ordered   Labs are reassuring, CBC, metabolic panel and troponin are all normal, urinalysis normal  MRI of the brain with and without contrast does not show any acute abnormalities, shows chronic micro ischemic disease  I did discuss findings with patient.  He was placed on HCTZ, Antivert, and 30 mg prednisone for 3 days.  Follow-up with ENT for evaluation of vertigo. For insomnia he was instructed he can over-the-counter melatonin.  He was discharged stable condition.  Matthew Nolan was evaluated in Emergency Department on 01/31/2021 for the symptoms described in the history of present illness. He was evaluated in the context of the global COVID-19 pandemic, which necessitated consideration that the patient might be at risk for infection with the SARS-CoV-2 virus that causes COVID-19. Institutional protocols and algorithms that pertain to the evaluation of patients at risk for COVID-19 are in a state of rapid change based on information released by regulatory bodies including the CDC and federal and state organizations. These policies and algorithms were followed during the patient's care in the ED.    As part of my medical decision making, I reviewed the following data within the [place] notes reviewed and incorporated, Labs reviewed , EKG interpreted NSR, Old chart  reviewed, Radiograph reviewed , Notes from prior ED visits and [place] Controlled Substance Database  ____________________________________________   FINAL CLINICAL IMPRESSION(S) / ED DIAGNOSES  Final diagnoses:  Vertigo  Dizziness      NEW MEDICATIONS STARTED DURING THIS VISIT:  New Prescriptions   HYDROCHLOROTHIAZIDE (HYDRODIURIL) 25 MG TABLET    Take 1 tablet (25 mg total) by mouth daily.   MECLIZINE (ANTIVERT) 50 MG TABLET    1/2 pill tid   PREDNISONE (DELTASONE) 10 MG TABLET    Take 3 tablets (30 mg total) by mouth daily with breakfast.     Note:  This document was prepared using Dragon voice recognition software and may include unintentional dictation errors.    Versie Starks, PA-C 01/31/21 1510    Duffy Bruce, MD 02/06/21 1037

## 2021-01-31 NOTE — ED Triage Notes (Signed)
Pt has a hx of vertigo and has had issues with it for the past few months- pt was given a prescription for some medication but he states it did not help

## 2021-01-31 NOTE — Discharge Instructions (Addendum)
Follow-up with your regular doctor in 2 to 3 days as needed Follow-up with the ear nose and throat doctors concerning the vertigo For insomnia try taking over-the-counter melatonin Take the hydrochlorothiazide, prednisone, and Antivert as prescribed

## 2021-02-04 ENCOUNTER — Other Ambulatory Visit: Payer: Self-pay

## 2021-02-04 ENCOUNTER — Ambulatory Visit: Payer: Self-pay | Admitting: Physician Assistant

## 2021-02-04 DIAGNOSIS — Z113 Encounter for screening for infections with a predominantly sexual mode of transmission: Secondary | ICD-10-CM

## 2021-02-04 DIAGNOSIS — N341 Nonspecific urethritis: Secondary | ICD-10-CM

## 2021-02-04 LAB — GRAM STAIN

## 2021-02-04 MED ORDER — DOXYCYCLINE HYCLATE 100 MG PO TABS: 100.0000 mg | ORAL_TABLET | Freq: Two times a day (BID) | ORAL | 0 refills | Status: AC

## 2021-02-04 NOTE — Progress Notes (Signed)
Pt here for STD screening.  Gram Stain results reviewed with Provider.  Medication dispensed per Provider orders.  Condoms given. Windle Guard, RN

## 2021-02-05 ENCOUNTER — Encounter: Payer: Self-pay | Admitting: Physician Assistant

## 2021-02-05 NOTE — Progress Notes (Signed)
[facility] Department STI clinic/screening visit  Subjective:  Matthew Nolan is a 55 y.o. male being seen today for an STI screening visit. The patient reports they do have symptoms.    Patient has the following medical conditions:   Patient Active Problem List   Diagnosis Date Noted  . GI bleed 06/10/2017  . Lower GI bleed 06/10/2017     Chief Complaint  Patient presents with  . SEXUALLY TRANSMITTED DISEASE    screening    HPI  Patient reports that he has had clear discharge from his penis for 2 weeks.  Denies other symptoms.  Reports that he takes medicine for high blood pressure and is currently taking vertigo medicines.  States last HIV test was 6 years ago and last void prior to sample collection for Gram stain was over 2 hr ago.     See flowsheet for further details and programmatic requirements.    The following portions of the patient's history were reviewed and updated as appropriate: allergies, current medications, past medical history, past social history, past surgical history and problem list.  Objective:  There were no vitals filed for this visit.  Physical Exam Constitutional:      General: He is not in acute distress.    Appearance: Normal appearance.  HENT:     Head: Normocephalic and atraumatic.     Comments: No nits,lice, or hair loss. No cervical, supraclavicular or axillary adenopathy.    Mouth/Throat:     Mouth: Mucous membranes are moist.     Pharynx: Oropharynx is clear. No oropharyngeal exudate or posterior oropharyngeal erythema.  Eyes:     Conjunctiva/sclera: Conjunctivae normal.  Pulmonary:     Effort: Pulmonary effort is normal.  Abdominal:     Palpations: Abdomen is soft. There is no mass.     Tenderness: There is no abdominal tenderness. There is no guarding or rebound.  Genitourinary:    Penis: Normal.      Testes: Normal.     Comments: Pubic area without nits, lice, hair loss, edema, erythema, lesions and  inguinal adenopathy. Penis circumcised without rash, lesions and discharge at meatus.  Meatus with mild edema and erythema. Testicles descended bilaterally,nt, no masses or edema. Musculoskeletal:     Cervical back: Neck supple. No tenderness.  Skin:    General: Skin is warm and dry.     Findings: No bruising, erythema, lesion or rash.  Neurological:     Mental Status: He is alert and oriented to person, place, and time.  Psychiatric:        Mood and Affect: Mood normal.        Behavior: Behavior normal.        Thought Content: Thought content normal.        Judgment: Judgment normal.       Assessment and Plan:  Matthew Nolan is a 55 y.o. male presenting to the [facility] Department for STI screening  1. Screening for STD (sexually transmitted disease) Patient into clinic with symptoms. Rec condoms with all sex. Await test results.  Counseled that RN will call if needs to RTC for treatment once results are back. - Gram stain - Gonococcus culture - HIV [facility] - Syphilis Serology, [facility]  2. NGU (nongonococcal urethritis) Will treat for NGU due to patient symptoms and exam findings with Doxycycline 100 mg #14 1 po BID for 7 days. No sex for 14 days and until after partner completes treatment. Call with  questions or concerns. - doxycycline (VIBRA-TABS) 100 MG tablet; Take 1 tablet (100 mg total) by mouth 2 (two) times daily for 7 days.  Dispense: 14 tablet; Refill: 0     No follow-ups on file.  No future appointments.  Jerene Dilling, PA

## 2021-02-07 LAB — HM HIV SCREENING LAB: HM HIV Screening: NEGATIVE

## 2021-02-08 LAB — GONOCOCCUS CULTURE

## 2021-09-16 IMAGING — CT CT CERVICAL SPINE W/O CM
3 of 4 series · 9 of 33 positions shown, 11 images · non-contrast
Comparison: Cxr 10/31/20.
COMPARISON: Cxr 10/31/20.

Addendum:
CLINICAL DATA: Cervical radiculopathy. Slurred speech, mobility
issue in sharp pains in shoulder. Possible pneumothorax.

EXAM:
CT CHEST WITHOUT CONTRAST
TECHNIQUE: Multidetector CT imaging of the cervical spine was performed without
intravenous contrast. Multiplanar CT image reconstructions were also
generated.
Multidetector CT imaging of the chest was performed following the
standard protocol without IV contrast.

[Series 6: sagittal bone · sagittal · 0.27mm/px · 5 of 67 slices shown, 6 images]
[im 23/67  bone]
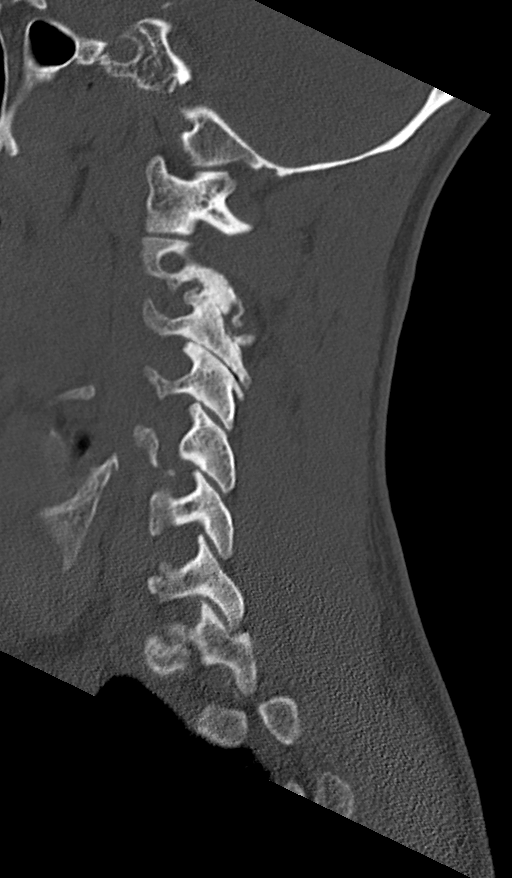
[im 28/67  bone]
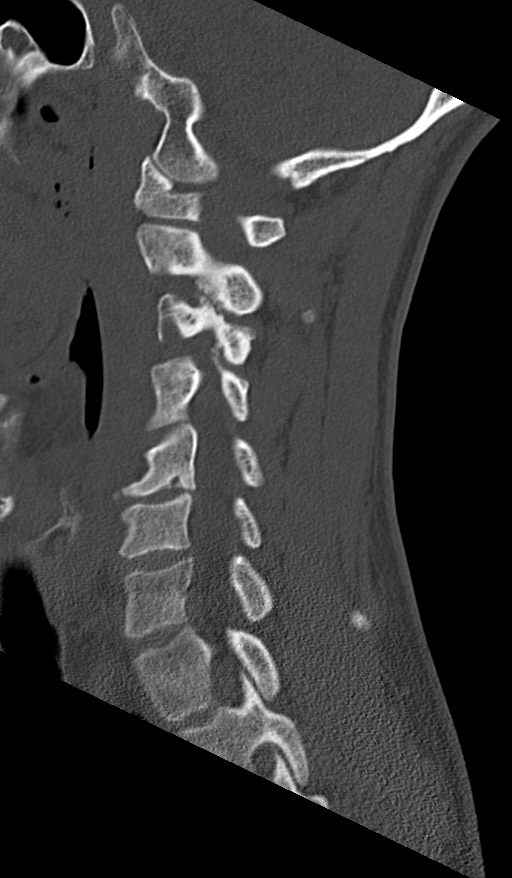
[im 34/67  soft-tissue]
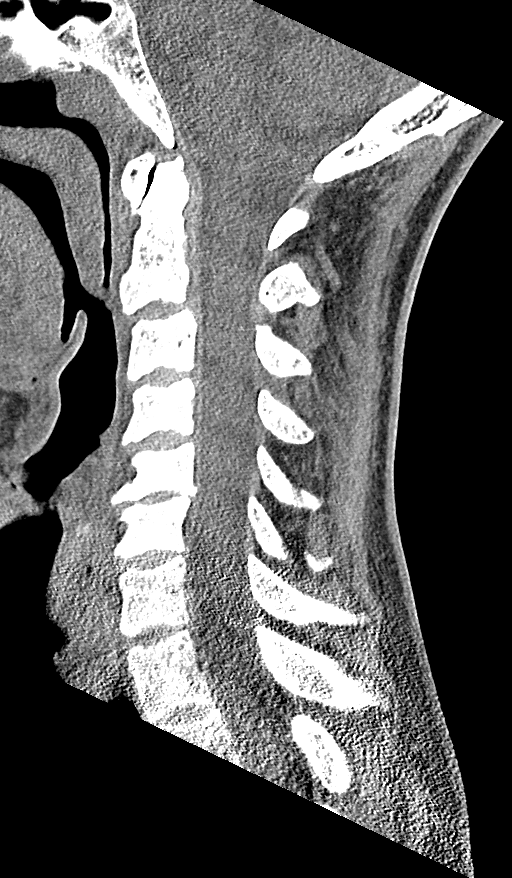
[im 34/67  bone]
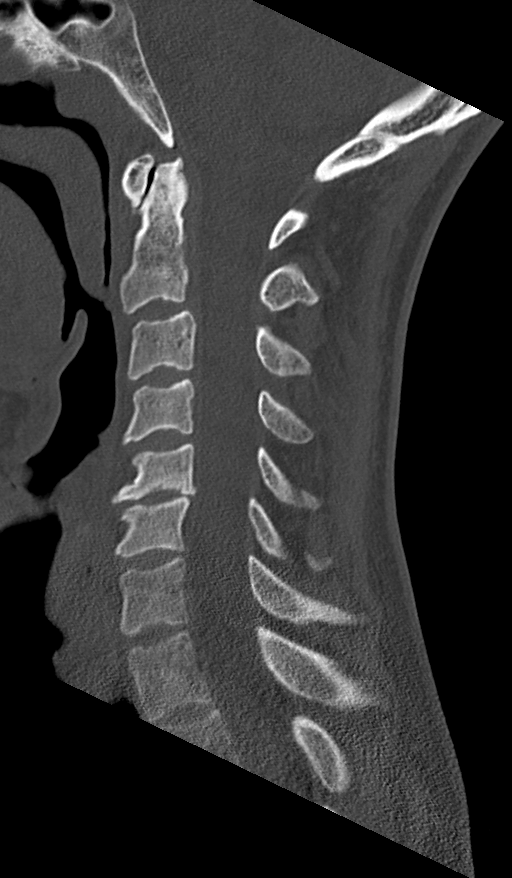
[im 39/67  bone]
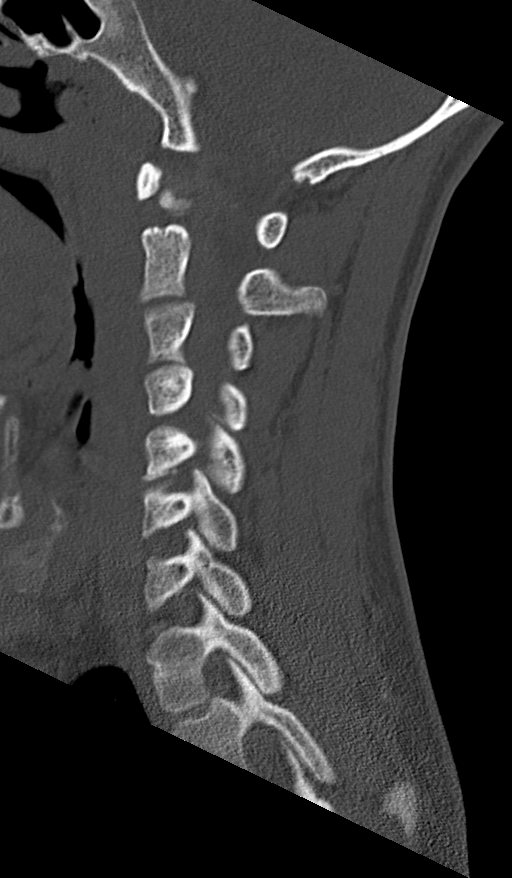
[im 45/67  bone]
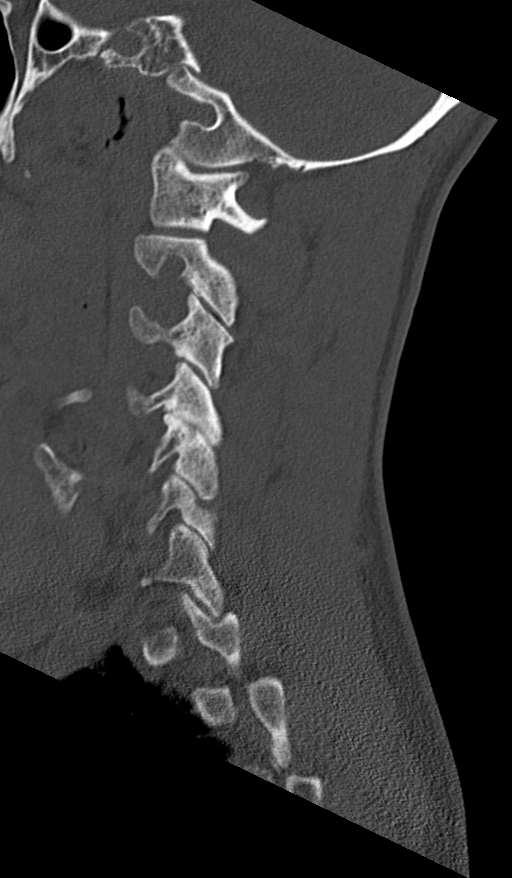

[Series 7: coronal bone · coronal · 0.30mm/px · 3 of 50 slices shown]
[im 10/50  bone]
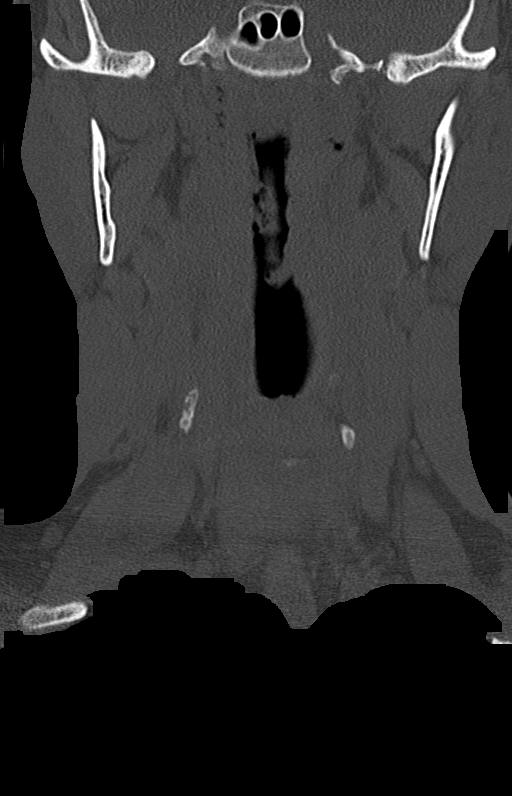
[im 20/50  bone]
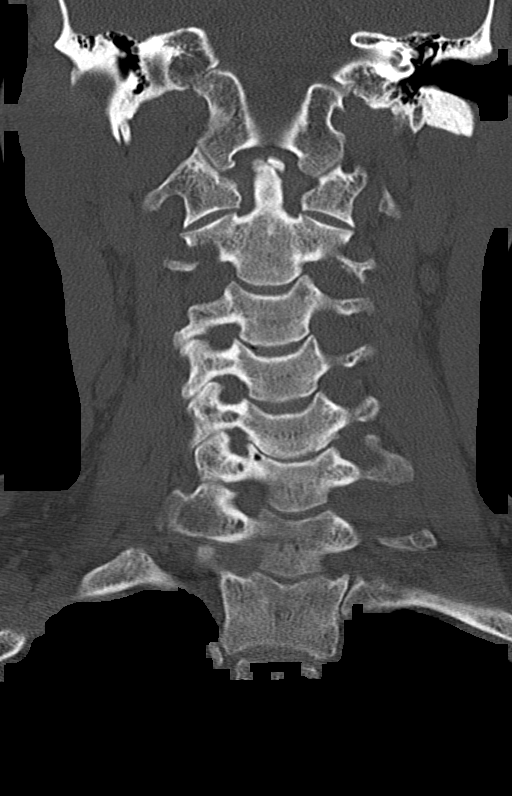
[im 30/50  bone]
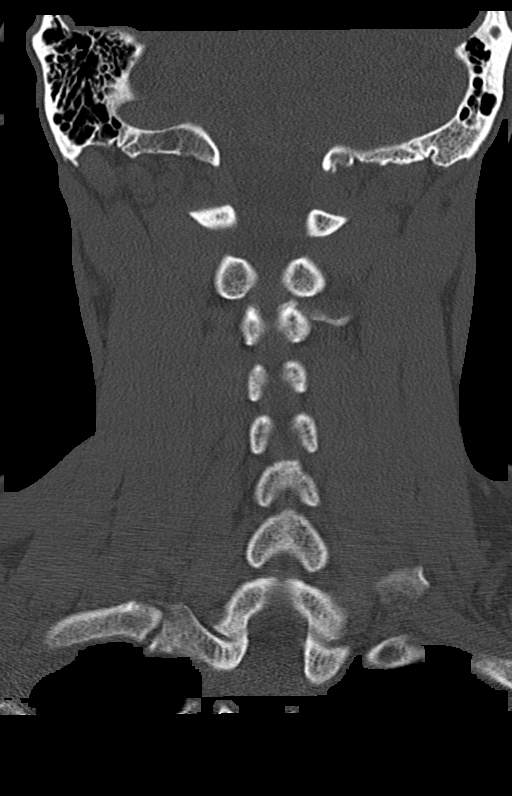

[Series 8: orthogonal bone · axial · 0.26mm/px · z∈[-212,-212]mm · 1 of 112 slices shown, 2 images]
[im 64/112  soft-tissue]
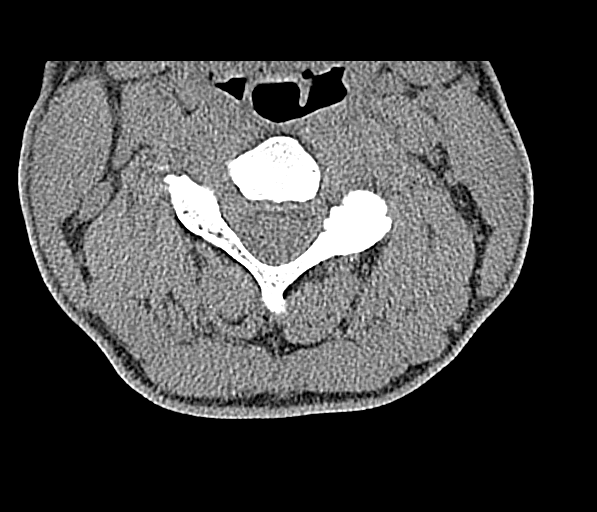
[im 64/112  bone]
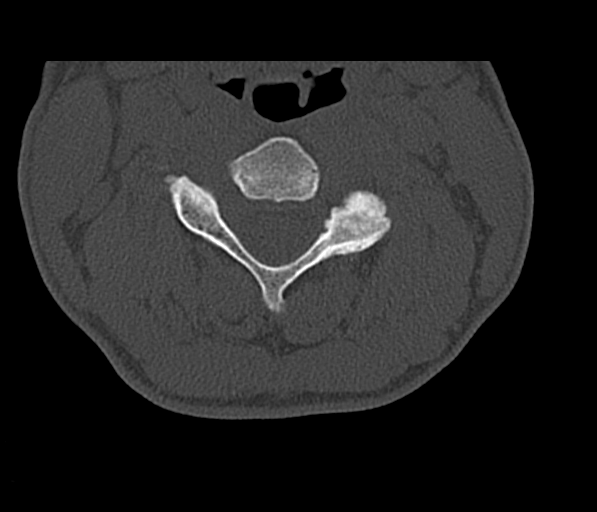

[9 of 33 positions shown; findings below may reference images not displayed]

FINDINGS: CT C SPINE:

Alignment: Normal.

Skull base and vertebrae: No mild multilevel degenerative changes of
the spine that is the worse at the C5-C6 level. No acute fracture.
No aggressive appearing focal osseous lesion or focal pathologic
process.

Soft tissues and spinal canal: No prevertebral fluid or swelling. No
visible canal hematoma.

Other: None.

CT CHEST:

Cardiovascular: Normal heart size. No significant pericardial
effusion. The thoracic aorta is normal in caliber. No
atherosclerotic plaque of the thoracic aorta. Mild coronary artery
calcifications.

Mediastinum/Nodes: No enlarged mediastinal or axillary lymph nodes.
No gross hilar adenopathy, noting limited sensitivity for the
detection of hilar adenopathy on this noncontrast study. Thyroid
gland, trachea, and esophagus demonstrate no significant findings.

Lungs/Pleura: A moderate paraseptal emphysematous changes of the
apices. Mild bilateral upper lobe centrilobular emphysematous
changes. No focal consolidation. There is a 4 mm nodule within the
right vertebral. COVID. No pleural mass. No pleural effusion. No
pneumothorax.

Upper Abdomen: No acute abnormality.

Musculoskeletal: No chest wall mass or suspicious bone lesions
identified.
IMPRESSION: 1. No acute displaced fracture or traumatic listhesis of the
cervical spine.
2. No pneumothorax.  No acute intrathoracic abnormality.
3.  Emphysema ([id number]).

ADDENDUM:
Please note "EXAM" should include both CT CERVICAL SPINE WITHOUT
CONTRAST and CT CHEST WITHOUT CONTRAST.

*** End of Addendum ***
FINDINGS: CT C SPINE:

Alignment: Normal.

Skull base and vertebrae: No mild multilevel degenerative changes of
the spine that is the worse at the C5-C6 level. No acute fracture.
No aggressive appearing focal osseous lesion or focal pathologic
process.

Soft tissues and spinal canal: No prevertebral fluid or swelling. No
visible canal hematoma.

Other: None.

CT CHEST:

Cardiovascular: Normal heart size. No significant pericardial
effusion. The thoracic aorta is normal in caliber. No
atherosclerotic plaque of the thoracic aorta. Mild coronary artery
calcifications.

Mediastinum/Nodes: No enlarged mediastinal or axillary lymph nodes.
No gross hilar adenopathy, noting limited sensitivity for the
detection of hilar adenopathy on this noncontrast study. Thyroid
gland, trachea, and esophagus demonstrate no significant findings.

Lungs/Pleura: A moderate paraseptal emphysematous changes of the
apices. Mild bilateral upper lobe centrilobular emphysematous
changes. No focal consolidation. There is a 4 mm nodule within the
right vertebral. COVID. No pleural mass. No pleural effusion. No
pneumothorax.

Upper Abdomen: No acute abnormality.

Musculoskeletal: No chest wall mass or suspicious bone lesions
identified.
IMPRESSION: 1. No acute displaced fracture or traumatic listhesis of the
cervical spine.
2. No pneumothorax.  No acute intrathoracic abnormality.
3.  Emphysema ([id number]).

## 2021-09-16 IMAGING — CT CT HEAD W/O CM
4 series · 16 of 47 positions shown, 18 images · non-contrast
Comparison: None.

CLINICAL DATA: Slurred speech mobility issues

EXAM:
CT HEAD WITHOUT CONTRAST
TECHNIQUE: Contiguous axial images were obtained from the base of the skull
through the vertex without intravenous contrast.

[Series 2: head wo · axial · 0.42mm/px · z∈[-182,-82]mm · 6 of 30 slices shown, 8 images]
[im 5/30  brain]
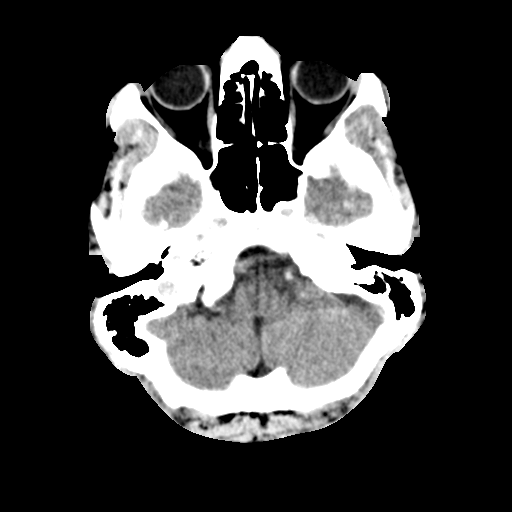
[im 5/30  bone]
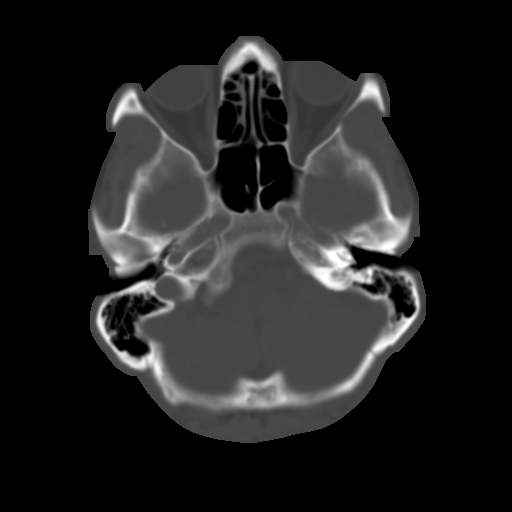
[im 9/30  brain]
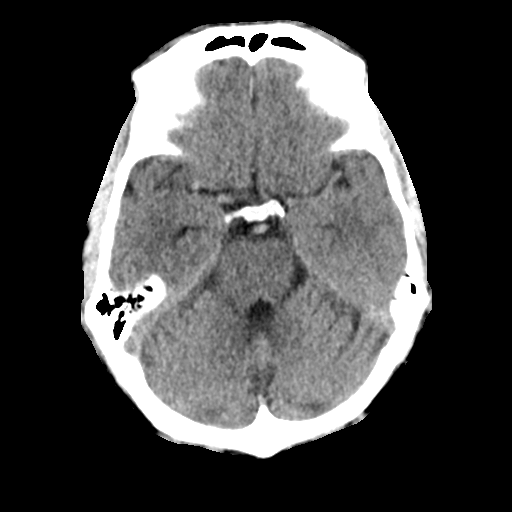
[im 13/30  brain]
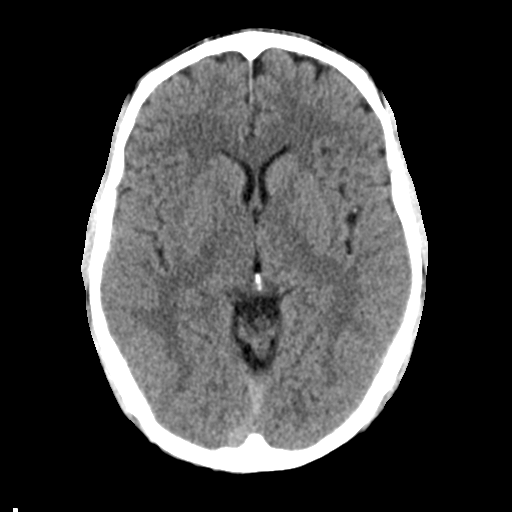
[im 17/30  brain]
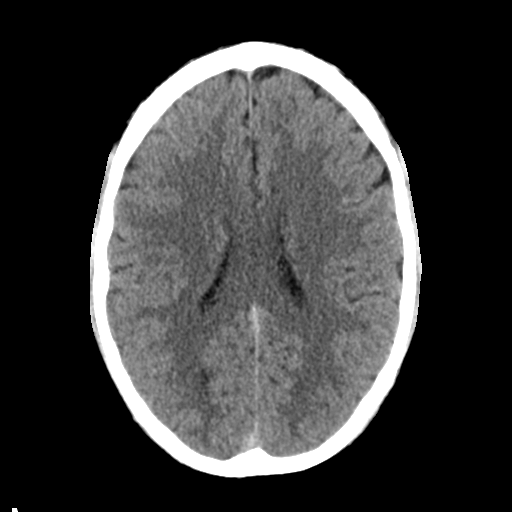
[im 21/30  brain]
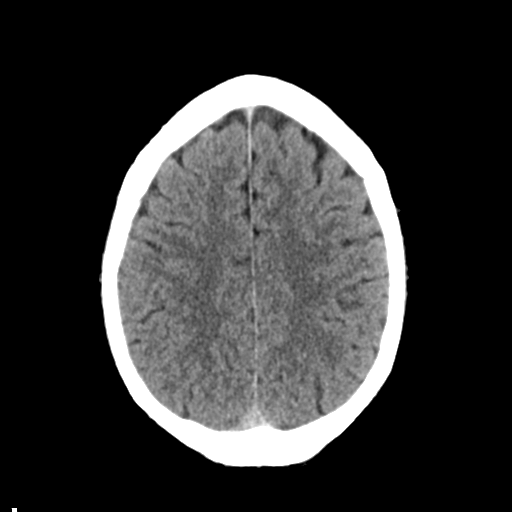
[im 21/30  bone]
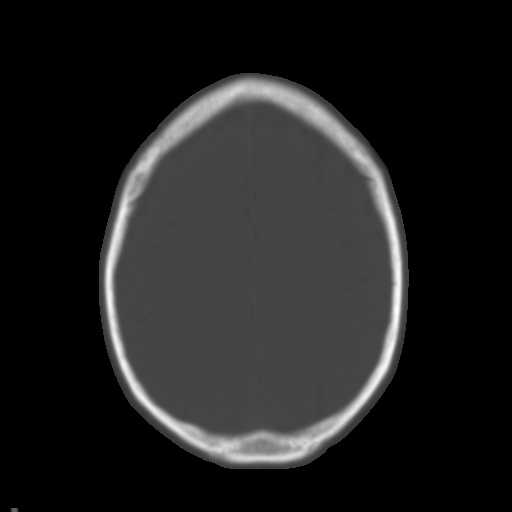
[im 25/30  brain]
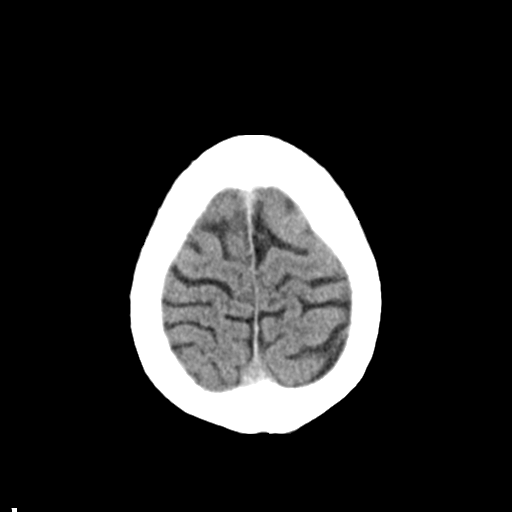

[Series 3: head bone · axial · 0.42mm/px · z∈[-188,-138]mm · 4 of 77 slices shown]
[im 8/77  bone]
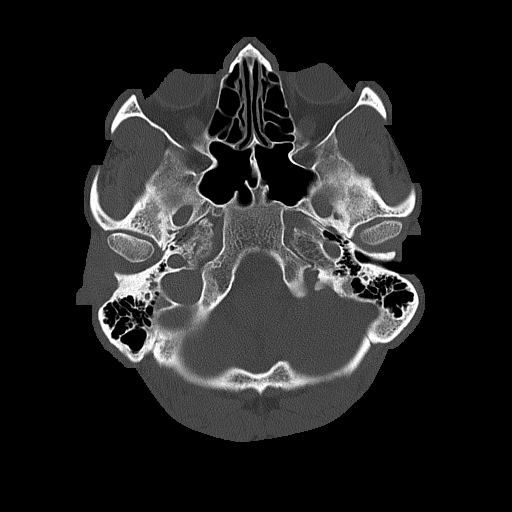
[im 15/77  bone]
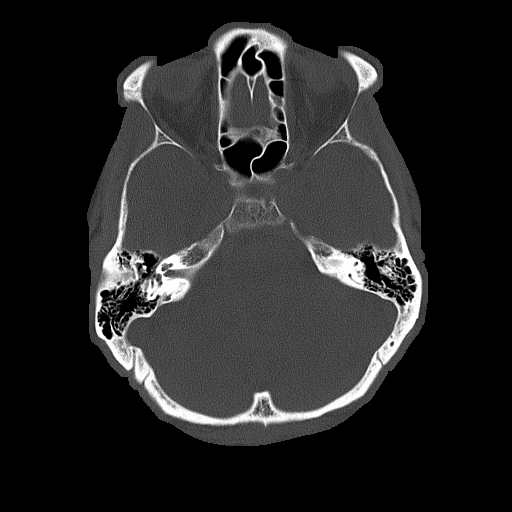
[im 26/77  bone]
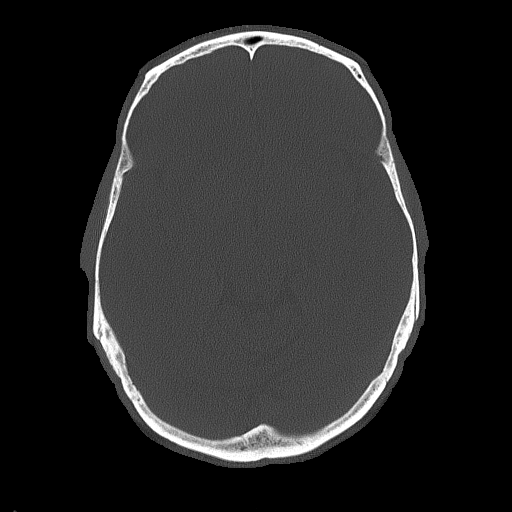
[im 33/77  bone]
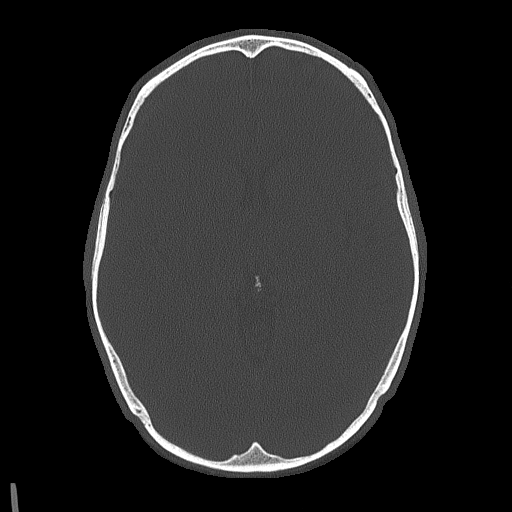

[Series 4: coronal soft tissue · coronal · 0.33mm/px · 3 of 63 slices shown]
[im 21/63  brain]
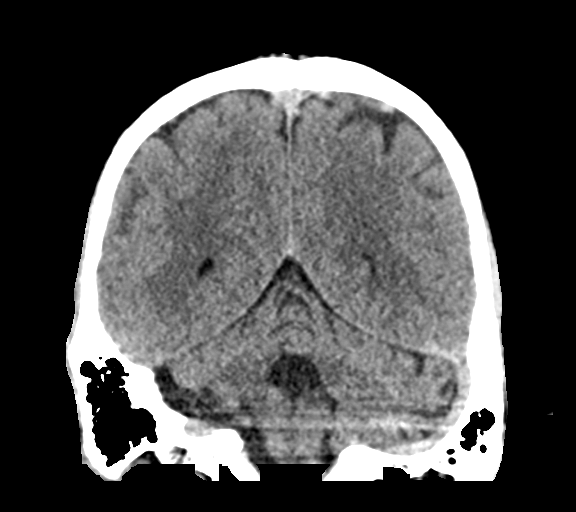
[im 28/63  brain]
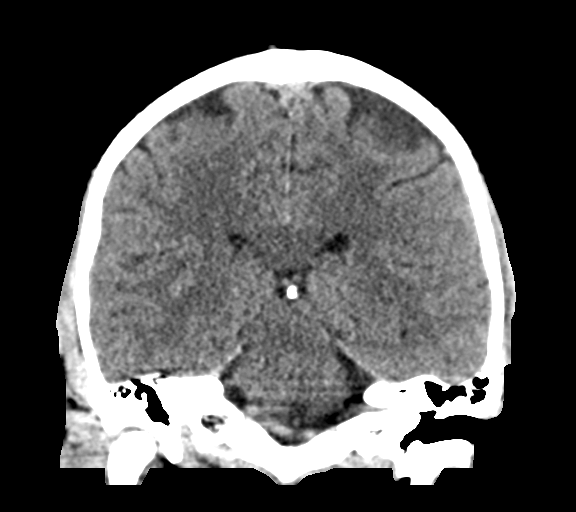
[im 35/63  brain]
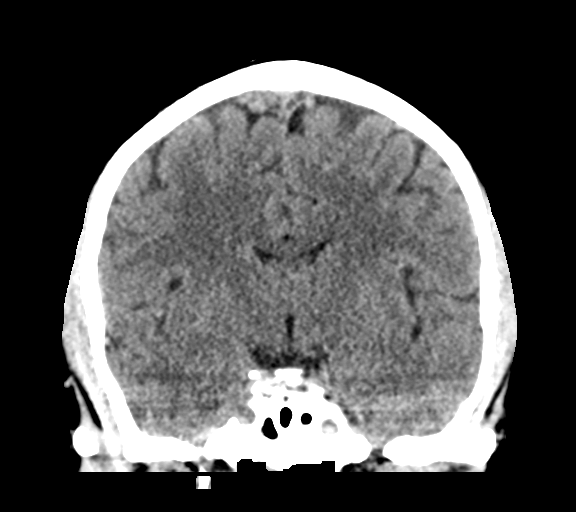

[Series 5: sagittal soft tissue · sagittal · 0.33mm/px · 3 of 52 slices shown]
[im 18/52  brain]
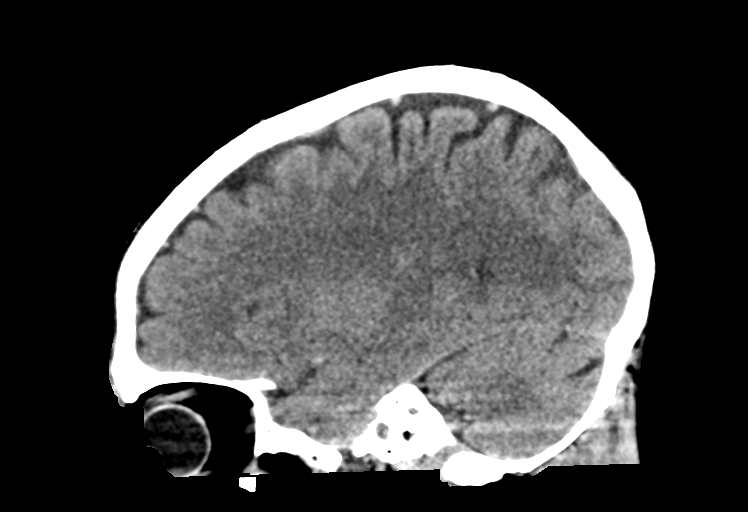
[im 26/52  brain]
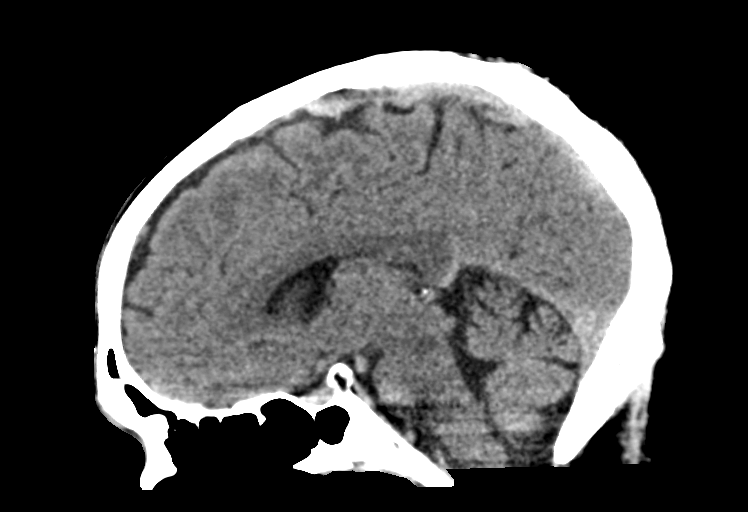
[im 35/52  brain]
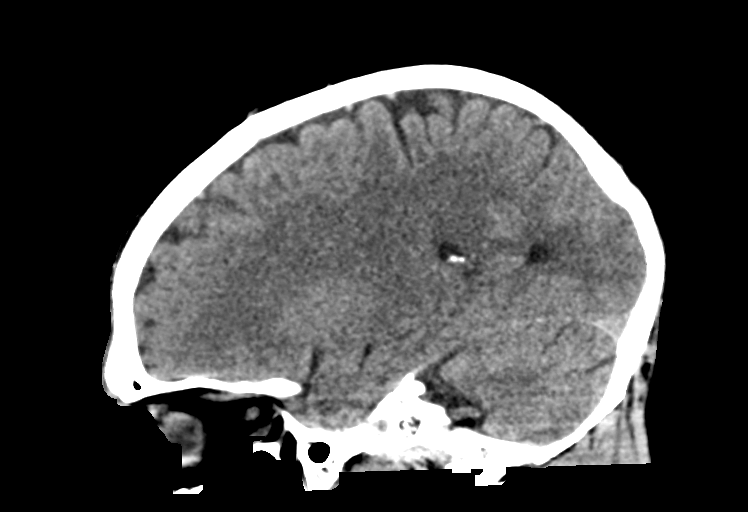

[16 of 47 positions shown; findings below may reference images not displayed]

FINDINGS: Brain: No evidence of acute infarction, hemorrhage, hydrocephalus,
extra-axial collection or mass lesion/mass effect.

Vascular: No hyperdense vessel or unexpected calcification.

Skull: Normal. Negative for fracture or focal lesion.

Sinuses/Orbits: No acute finding.

Other: None
IMPRESSION: Negative non contrasted CT appearance of the brain.

## 2023-10-03 ENCOUNTER — Other Ambulatory Visit: Payer: Self-pay

## 2023-10-03 ENCOUNTER — Emergency Department: Payer: 59

## 2023-10-03 ENCOUNTER — Emergency Department
Admission: EM | Admit: 2023-10-03 | Discharge: 2023-10-03 | Disposition: A | Discharge status: Home / Self Care | Payer: 59 | Attending: Emergency Medicine | Admitting: Emergency Medicine

## 2023-10-03 DIAGNOSIS — Y92003 Bedroom of unspecified non-institutional (private) residence as the place of occurrence of the external cause: Secondary | ICD-10-CM | POA: Diagnosis not present

## 2023-10-03 DIAGNOSIS — S0181XA Laceration without foreign body of other part of head, initial encounter: Secondary | ICD-10-CM | POA: Diagnosis present

## 2023-10-03 DIAGNOSIS — W19XXXA Unspecified fall, initial encounter: Secondary | ICD-10-CM

## 2023-10-03 DIAGNOSIS — W06XXXA Fall from bed, initial encounter: Secondary | ICD-10-CM | POA: Insufficient documentation

## 2023-10-03 LAB — BASIC METABOLIC PANEL
Anion gap: 10 (ref 5–15)
BUN: 13 mg/dL (ref 6–20)
CO2: 21 mmol/L — ABNORMAL LOW (ref 22–32)
Calcium: 9.8 mg/dL (ref 8.9–10.3)
Chloride: 103 mmol/L (ref 98–111)
Creatinine, Ser: 0.92 mg/dL (ref 0.61–1.24)
GFR, Estimated: >60 mL/min (ref >60)
Glucose, Bld: 93 mg/dL (ref 70–99)
Potassium: 3.5 mmol/L (ref 3.5–5.1)
Sodium: 134 mmol/L — ABNORMAL LOW (ref 135–145)

## 2023-10-03 LAB — CBC WITH DIFFERENTIAL/PLATELET
Abs Immature Granulocytes: 0.02 10*3/uL (ref 0.00–0.07)
Basophils Absolute: 0.1 10*3/uL (ref 0.0–0.1)
Basophils Relative: 2 %
Eosinophils Absolute: 0.1 10*3/uL (ref 0.0–0.5)
Eosinophils Relative: 2 %
HCT: 42 % (ref 39.0–52.0)
Hemoglobin: 14.3 g/dL (ref 13.0–17.0)
Immature Granulocytes: 0 %
Lymphocytes Relative: 39 %
Lymphs Abs: 3.4 10*3/uL (ref 0.7–4.0)
MCH: 31.1 pg (ref 26.0–34.0)
MCHC: 34 g/dL (ref 30.0–36.0)
MCV: 91.3 fL (ref 80.0–100.0)
Monocytes Absolute: 0.7 10*3/uL (ref 0.1–1.0)
Monocytes Relative: 8 %
Neutro Abs: 4.1 10*3/uL (ref 1.7–7.7)
Neutrophils Relative %: 49 %
Platelets: 275 10*3/uL (ref 150–400)
RBC: 4.6 MIL/uL (ref 4.22–5.81)
RDW: 11.7 % (ref 11.5–15.5)
WBC: 8.5 10*3/uL (ref 4.0–10.5)
nRBC: 0 % (ref 0.0–0.2)

## 2023-10-03 MED ORDER — LIDOCAINE-EPINEPHRINE 1 %-1:100000 IJ SOLN
20.0000 mL | Freq: Once | INTRAMUSCULAR | Status: AC
  Administered 2023-10-03: 20 mL via INTRADERMAL
  Filled 2023-10-03: qty 20
  Filled 2023-10-03: qty 1

## 2023-10-03 NOTE — Discharge Instructions (Addendum)
Keep the wound clean and dry. See your primary provider or a local urgent care center for suture removal in 5-7 days. TAke OTC Tylenol and Motrin for pain.

## 2023-10-03 NOTE — ED Triage Notes (Addendum)
Patient arriving via EMS, C/O mechanical fall. Patient states he has balance issues, got out of bed this morning, slipped and hit his head on the floor. Patient has laceration right above left eyebrow. No blood thinners. Denies any LOC or visual changes, but states he does feel lightheaded and weak now.

## 2023-10-03 NOTE — ED Notes (Signed)
First Nurse Note: Pt to ED via [facility] from [facility] for a fall. Pt rolled out of bed and hit his head on the table. Pt is not on blood thinner but did take 2 Aspirin last night. EMS reports deep laceration on forehead. EMS put ABD pad on laceration and reports that the blood is starting to soak through.

## 2023-10-03 NOTE — ED Provider Notes (Signed)
[facility] Emergency Department Provider Note     None    (approximate)   History   Head Injury and Head Laceration   HPI  Matthew Nolan is a 58 y.o. male with a history of alcohol abuse, tobacco abuse, and vertigo, presents to the ED for evaluation following a mechanical fall.  He [place] via EMS from a local motel, after he reports he got out of bed this morning slipped and fell hitting his head on the floor.  He denies any LOC.  He reports a laceration over his left brow.  Denies any blood thinner use.  No other injuries reported at this time.  Physical Exam   Triage Vital Signs: ED Triage Vitals  Encounter Vitals Group     BP 10/03/23 0334 126/88     Systolic BP Percentile --      Diastolic BP Percentile --      Pulse Rate 10/03/23 0334 100     Resp 10/03/23 0334 18     Temp 10/03/23 0334 99 F (37.2 C)     Temp Source 10/03/23 0334 Oral     SpO2 10/03/23 0334 100 %     Weight 10/03/23 0331 164 lb (74.4 kg)     Height 10/03/23 0331 5\' 8"  (1.727 m)     Head Circumference --      Peak Flow --      Pain Score --      Pain Loc --      Pain Education --      Exclude from Growth Chart --     Most recent vital signs: Vitals:   10/03/23 0334  BP: 126/88  Pulse: 100  Resp: 18  Temp: 99 F (37.2 C)  SpO2: 100%    General Awake, no distress. NAD HEENT NCAT, except for a 1 cm laceration over the left lateral brow with persistent bleeding.Marland Kitchen PERRL. EOMI. No rhinorrhea. Mucous membranes are moist.  CV:  Good peripheral perfusion. RRR RESP:  Normal effort. CTA ABD:  No distention.  NEURO: Cranial nerves II to XII grossly intact.   ED Results / Procedures / Treatments   Labs (all labs ordered are listed, but only abnormal results are displayed) Labs Reviewed  BASIC METABOLIC PANEL - Abnormal; Notable for the following components:      Result Value   Sodium 134 (*)    CO2 21 (*)    All other components within normal limits  CBC  WITH DIFFERENTIAL/PLATELET     EKG  RADIOLOGY  I personally viewed and evaluated these images as part of my medical decision making, as well as reviewing the written report by the radiologist.  ED Provider Interpretation: No acute findings  CT Head Wo Contrast Result Date: 10/03/2023 CLINICAL DATA:  Mechanical fall with head trauma. EXAM: CT HEAD WITHOUT CONTRAST TECHNIQUE: Contiguous axial images were obtained from the base of the skull through the vertex without intravenous contrast. RADIATION DOSE REDUCTION: This exam was performed according to the departmental dose-optimization program which includes automated exposure control, adjustment of the mA and/or kV according to patient size and/or use of iterative reconstruction technique. COMPARISON:  Brain MRI 01/31/2021 FINDINGS: Brain: No evidence of acute infarction, hemorrhage, hydrocephalus, extra-axial collection or mass lesion/mass effect. Vascular: No hyperdense vessel or unexpected calcification. Skull: Swelling with bandage over the left temporal scalp. No acute fracture Sinuses/Orbits: Negative IMPRESSION: No evidence of intracranial injury.  Negative for fracture. Electronically Signed   By: Tiburcio Pea  M.D.   On: 10/03/2023 05:18     PROCEDURES:  Critical Care performed: No  .Laceration Repair  Date/Time: 10/03/2023 7:26 AM  Performed by: Lissa Hoard, PA-C Authorized by: Lissa Hoard, PA-C   Consent:    Consent obtained:  Verbal   Consent given by:  Patient   Risks, benefits, and alternatives were discussed: yes     Risks discussed:  Poor cosmetic result Universal protocol:    Imaging studies available: yes     Site/side marked: yes     Patient identity confirmed:  Verbally with patient Anesthesia:    Anesthesia method:  Local infiltration   Local anesthetic:  Lidocaine 1% WITH epi Laceration details:    Location:  Face   Face location:  L eyebrow   Length (cm):  1   Depth (mm):   5 Pre-procedure details:    Preparation:  Patient was prepped and draped in usual sterile fashion Exploration:    Hemostasis achieved with:  Direct pressure and epinephrine   Contaminated: no   Treatment:    Area cleansed with:  Povidone-iodine   Amount of cleaning:  Standard   Irrigation solution:  Sterile saline   Irrigation method:  Syringe   Debridement:  None   Undermining:  None   Scar revision: no   Skin repair:    Repair method:  Sutures   Suture size:  5-0   Suture material:  Nylon   Suture technique:  Simple interrupted   Number of sutures:  4 Approximation:    Approximation:  Close Repair type:    Repair type:  Simple Post-procedure details:    Dressing:  Open (no dressing)   Procedure completion:  Tolerated well, no immediate complications    MEDICATIONS ORDERED IN ED: Medications - No data to display   IMPRESSION / MDM / ASSESSMENT AND PLAN / ED COURSE  I reviewed the triage vital signs and the nursing notes.                              Differential diagnosis includes, but is not limited to, SDH, facial fracture, intracranial bleed, facial contusion, laceration, concussion  Patient's presentation is most consistent with acute complicated illness / injury requiring diagnostic workup.  Patient's diagnosis is consistent with facial contusion and left brow laceration. Patient will be discharged home with wound care instructions. Patient is to follow up with his PCP discussed, as needed or otherwise directed. Patient is given ED precautions to return to the ED for any worsening or new symptoms.   FINAL CLINICAL IMPRESSION(S) / ED DIAGNOSES   Final diagnoses:  Fall, initial encounter  Facial laceration, initial encounter     Rx / DC Orders   ED Discharge Orders     None        Note:  This document was prepared using Dragon voice recognition software and may include unintentional dictation errors.    Lissa Hoard, PA-C 10/03/23  [id number]    Jene Every, MD 10/03/23 [phone]

## 2023-10-03 NOTE — ED Notes (Signed)
PA at bedside.
